# Patient Record
Sex: Female | Born: 1960 | Race: Black or African American | Hispanic: No | Marital: Single | Smoking: Never smoker
Health system: Southern US, Community
[De-identification: ages and names within clinical notes are randomized; demographics above are authoritative.]

## PROBLEM LIST (undated history)

## (undated) DIAGNOSIS — M359 Systemic involvement of connective tissue, unspecified: Secondary | ICD-10-CM

## (undated) DIAGNOSIS — M069 Rheumatoid arthritis, unspecified: Secondary | ICD-10-CM

## (undated) DIAGNOSIS — I10 Essential (primary) hypertension: Secondary | ICD-10-CM

## (undated) DIAGNOSIS — C50912 Malignant neoplasm of unspecified site of left female breast: Secondary | ICD-10-CM

## (undated) DIAGNOSIS — C50919 Malignant neoplasm of unspecified site of unspecified female breast: Secondary | ICD-10-CM

## (undated) DIAGNOSIS — C801 Malignant (primary) neoplasm, unspecified: Secondary | ICD-10-CM

## (undated) HISTORY — DX: Rheumatoid arthritis, unspecified: M06.9

## (undated) HISTORY — DX: Malignant (primary) neoplasm, unspecified: C80.1

## (undated) HISTORY — DX: Malignant neoplasm of unspecified site of left female breast: C50.912

## (undated) HISTORY — DX: Malignant neoplasm of unspecified site of unspecified female breast: C50.919

## (undated) HISTORY — PX: CARPAL TUNNEL RELEASE: SHX101

---

## 1991-02-20 HISTORY — PX: TUBAL LIGATION: SHX77

## 2007-06-20 ENCOUNTER — Emergency Department: Payer: Self-pay | Admitting: Emergency Medicine

## 2007-07-24 ENCOUNTER — Emergency Department: Payer: Self-pay | Admitting: Emergency Medicine

## 2008-02-20 HISTORY — PX: BREAST SURGERY: SHX581

## 2009-07-20 ENCOUNTER — Ambulatory Visit: Payer: Self-pay | Admitting: Oncology

## 2009-08-04 ENCOUNTER — Ambulatory Visit: Payer: Self-pay | Admitting: Oncology

## 2009-08-05 ENCOUNTER — Ambulatory Visit: Payer: Self-pay | Admitting: Oncology

## 2009-08-09 ENCOUNTER — Ambulatory Visit: Payer: Self-pay | Admitting: Oncology

## 2009-08-17 ENCOUNTER — Ambulatory Visit: Payer: Self-pay | Admitting: General Surgery

## 2009-08-19 ENCOUNTER — Ambulatory Visit: Payer: Self-pay | Admitting: Oncology

## 2009-08-28 LAB — CANCER ANTIGEN 27.29: CA 27.29: 16.1 U/mL (ref 0.0–38.6)

## 2009-09-19 ENCOUNTER — Ambulatory Visit: Payer: Self-pay | Admitting: Oncology

## 2009-10-20 ENCOUNTER — Ambulatory Visit: Payer: Self-pay | Admitting: Oncology

## 2009-11-19 ENCOUNTER — Ambulatory Visit: Payer: Self-pay | Admitting: Oncology

## 2009-12-20 ENCOUNTER — Ambulatory Visit: Payer: Self-pay | Admitting: Oncology

## 2010-01-19 ENCOUNTER — Ambulatory Visit: Payer: Self-pay | Admitting: Oncology

## 2010-02-19 ENCOUNTER — Ambulatory Visit: Payer: Self-pay | Admitting: Oncology

## 2010-02-19 DIAGNOSIS — C801 Malignant (primary) neoplasm, unspecified: Secondary | ICD-10-CM

## 2010-02-19 HISTORY — DX: Malignant (primary) neoplasm, unspecified: C80.1

## 2010-02-27 ENCOUNTER — Ambulatory Visit: Payer: Self-pay | Admitting: Anesthesiology

## 2010-03-01 ENCOUNTER — Ambulatory Visit: Payer: Self-pay | Admitting: General Surgery

## 2010-03-07 LAB — PATHOLOGY REPORT

## 2010-03-12 ENCOUNTER — Encounter: Payer: Self-pay | Admitting: Radiology

## 2010-03-22 ENCOUNTER — Ambulatory Visit: Payer: Self-pay | Admitting: Oncology

## 2010-04-20 ENCOUNTER — Ambulatory Visit: Payer: Self-pay | Admitting: Oncology

## 2010-05-21 ENCOUNTER — Ambulatory Visit: Payer: Self-pay | Admitting: Oncology

## 2010-06-20 ENCOUNTER — Ambulatory Visit: Payer: Self-pay | Admitting: Oncology

## 2010-07-21 ENCOUNTER — Ambulatory Visit: Payer: Self-pay | Admitting: Oncology

## 2010-08-20 ENCOUNTER — Ambulatory Visit: Payer: Self-pay | Admitting: Oncology

## 2010-09-15 LAB — CANCER ANTIGEN 27.29: CA 27.29: 12.4 U/mL (ref 0.0–38.6)

## 2010-09-20 ENCOUNTER — Ambulatory Visit: Payer: Self-pay | Admitting: Oncology

## 2010-10-21 ENCOUNTER — Ambulatory Visit: Payer: Self-pay | Admitting: Oncology

## 2011-01-17 ENCOUNTER — Ambulatory Visit: Payer: Self-pay | Admitting: Oncology

## 2011-01-20 ENCOUNTER — Ambulatory Visit: Payer: Self-pay | Admitting: Oncology

## 2011-01-24 ENCOUNTER — Ambulatory Visit: Payer: Self-pay | Admitting: General Surgery

## 2011-03-28 ENCOUNTER — Ambulatory Visit: Payer: Self-pay | Admitting: Radiation Oncology

## 2011-03-28 ENCOUNTER — Ambulatory Visit: Payer: Self-pay | Admitting: Oncology

## 2011-03-30 ENCOUNTER — Emergency Department: Payer: Self-pay | Admitting: Unknown Physician Specialty

## 2011-04-20 ENCOUNTER — Ambulatory Visit: Payer: Self-pay | Admitting: Oncology

## 2011-05-22 ENCOUNTER — Ambulatory Visit: Payer: Self-pay | Admitting: Oncology

## 2011-05-22 LAB — COMPREHENSIVE METABOLIC PANEL
Anion Gap: 9 (ref 7–16)
BUN: 13 mg/dL (ref 7–18)
Bilirubin,Total: 0.4 mg/dL (ref 0.2–1.0)
Calcium, Total: 9.1 mg/dL (ref 8.5–10.1)
Chloride: 102 mmol/L (ref 98–107)
Co2: 29 mmol/L (ref 21–32)
EGFR (African American): >60
EGFR (Non-African Amer.): 56 — ABNORMAL LOW
Glucose: 111 mg/dL — ABNORMAL HIGH (ref 65–99)
Osmolality: 280 (ref 275–301)
SGOT(AST): 23 U/L (ref 15–37)
SGPT (ALT): 28 U/L
Sodium: 140 mmol/L (ref 136–145)
Total Protein: 8 g/dL (ref 6.4–8.2)

## 2011-05-22 LAB — CBC CANCER CENTER
Basophil #: 0 x10 3/mm (ref 0.0–0.1)
Basophil %: 0.4 %
HCT: 31.9 % — ABNORMAL LOW (ref 35.0–47.0)
HGB: 10.8 g/dL — ABNORMAL LOW (ref 12.0–16.0)
Lymphocyte #: 2.1 x10 3/mm (ref 1.0–3.6)
MCHC: 33.9 g/dL (ref 32.0–36.0)
MCV: 90 fL (ref 80–100)
Monocyte #: 0.4 x10 3/mm (ref 0.0–0.7)
Monocyte %: 9.6 %
Neutrophil #: 1.5 x10 3/mm (ref 1.4–6.5)
Neutrophil %: 37.5 %
Platelet: 188 x10 3/mm (ref 150–440)
RBC: 3.53 10*6/uL — ABNORMAL LOW (ref 3.80–5.20)

## 2011-05-23 LAB — CANCER ANTIGEN 27.29: CA 27.29: 18.2 U/mL (ref 0.0–38.6)

## 2011-06-20 ENCOUNTER — Ambulatory Visit: Payer: Self-pay | Admitting: Oncology

## 2011-08-27 ENCOUNTER — Ambulatory Visit: Payer: Self-pay | Admitting: General Surgery

## 2011-12-13 ENCOUNTER — Ambulatory Visit: Payer: Self-pay | Admitting: Oncology

## 2011-12-13 LAB — COMPREHENSIVE METABOLIC PANEL
Albumin: 3.7 g/dL (ref 3.4–5.0)
Alkaline Phosphatase: 91 U/L (ref 50–136)
BUN: 16 mg/dL (ref 7–18)
EGFR (African American): >60
Glucose: 105 mg/dL — ABNORMAL HIGH (ref 65–99)
SGOT(AST): 22 U/L (ref 15–37)
SGPT (ALT): 24 U/L (ref 12–78)
Total Protein: 8.2 g/dL (ref 6.4–8.2)

## 2011-12-13 LAB — CBC CANCER CENTER
Basophil %: 0.4 %
Eosinophil #: 0.1 x10 3/mm (ref 0.0–0.7)
Eosinophil %: 2.3 %
HGB: 10.4 g/dL — ABNORMAL LOW (ref 12.0–16.0)
MCH: 30.2 pg (ref 26.0–34.0)
MCHC: 33.1 g/dL (ref 32.0–36.0)
MCV: 91 fL (ref 80–100)
Monocyte #: 0.4 x10 3/mm (ref 0.2–0.9)
Monocyte %: 10.6 %
Neutrophil %: 39.9 %
Platelet: 202 x10 3/mm (ref 150–440)

## 2011-12-14 LAB — CANCER ANTIGEN 27.29: CA 27.29: 13.4 U/mL (ref 0.0–38.6)

## 2011-12-21 ENCOUNTER — Ambulatory Visit: Payer: Self-pay | Admitting: Oncology

## 2012-01-20 ENCOUNTER — Ambulatory Visit: Payer: Self-pay | Admitting: Oncology

## 2012-03-22 ENCOUNTER — Ambulatory Visit: Payer: Self-pay | Admitting: Oncology

## 2012-04-19 ENCOUNTER — Ambulatory Visit: Payer: Self-pay | Admitting: Oncology

## 2012-05-20 ENCOUNTER — Ambulatory Visit: Payer: Self-pay | Admitting: Oncology

## 2012-06-05 LAB — CBC CANCER CENTER
Basophil %: 0.3 %
Eosinophil %: 1.1 %
HCT: 30.9 % — ABNORMAL LOW (ref 35.0–47.0)
HGB: 10.3 g/dL — ABNORMAL LOW (ref 12.0–16.0)
Lymphocyte #: 1.6 x10 3/mm (ref 1.0–3.6)
MCH: 30.3 pg (ref 26.0–34.0)
MCHC: 33.5 g/dL (ref 32.0–36.0)
MCV: 91 fL (ref 80–100)
Monocyte #: 0.6 x10 3/mm (ref 0.2–0.9)
Neutrophil #: 4.4 x10 3/mm (ref 1.4–6.5)
Neutrophil %: 66 %
RBC: 3.41 10*6/uL — ABNORMAL LOW (ref 3.80–5.20)
RDW: 14.1 % (ref 11.5–14.5)
WBC: 6.6 x10 3/mm (ref 3.6–11.0)

## 2012-06-05 LAB — COMPREHENSIVE METABOLIC PANEL
Alkaline Phosphatase: 87 U/L (ref 50–136)
BUN: 15 mg/dL (ref 7–18)
Chloride: 101 mmol/L (ref 98–107)
Co2: 30 mmol/L (ref 21–32)
Creatinine: 1.02 mg/dL (ref 0.60–1.30)
Glucose: 118 mg/dL — ABNORMAL HIGH (ref 65–99)
SGPT (ALT): 20 U/L (ref 12–78)
Sodium: 139 mmol/L (ref 136–145)
Total Protein: 8.5 g/dL — ABNORMAL HIGH (ref 6.4–8.2)

## 2012-06-19 ENCOUNTER — Ambulatory Visit: Payer: Self-pay | Admitting: Oncology

## 2012-08-27 ENCOUNTER — Ambulatory Visit: Payer: Self-pay | Admitting: Oncology

## 2012-12-23 ENCOUNTER — Ambulatory Visit: Payer: Self-pay | Admitting: Oncology

## 2012-12-23 LAB — CBC CANCER CENTER
Basophil %: 0.5 %
Eosinophil #: 0.1 x10 3/mm (ref 0.0–0.7)
Eosinophil %: 1.7 %
HCT: 34.4 % — ABNORMAL LOW (ref 35.0–47.0)
Lymphocyte %: 39.8 %
Monocyte %: 11.5 %
Neutrophil %: 46.5 %
Platelet: 212 x10 3/mm (ref 150–440)
RDW: 15.6 % — ABNORMAL HIGH (ref 11.5–14.5)
WBC: 4.9 x10 3/mm (ref 3.6–11.0)

## 2012-12-23 LAB — COMPREHENSIVE METABOLIC PANEL
Anion Gap: 6 — ABNORMAL LOW (ref 7–16)
BUN: 16 mg/dL (ref 7–18)
Bilirubin,Total: 0.3 mg/dL (ref 0.2–1.0)
Calcium, Total: 9.4 mg/dL (ref 8.5–10.1)
Chloride: 104 mmol/L (ref 98–107)
Co2: 29 mmol/L (ref 21–32)
Creatinine: 1.24 mg/dL (ref 0.60–1.30)
EGFR (African American): 58 — ABNORMAL LOW
Glucose: 96 mg/dL (ref 65–99)
Total Protein: 8.2 g/dL (ref 6.4–8.2)

## 2012-12-24 LAB — CANCER ANTIGEN 27.29: CA 27.29: 16.8 U/mL (ref 0.0–38.6)

## 2013-01-19 ENCOUNTER — Ambulatory Visit: Payer: Self-pay | Admitting: Oncology

## 2013-04-14 ENCOUNTER — Ambulatory Visit: Payer: Self-pay | Admitting: Radiation Oncology

## 2013-06-23 ENCOUNTER — Ambulatory Visit: Payer: Self-pay | Admitting: Oncology

## 2013-06-23 LAB — CBC CANCER CENTER
Basophil #: 0 x10 3/mm (ref 0.0–0.1)
Basophil %: 0.6 %
Eosinophil #: 0.1 x10 3/mm (ref 0.0–0.7)
Eosinophil %: 2.1 %
HCT: 32.4 % — AB (ref 35.0–47.0)
HGB: 10.6 g/dL — AB (ref 12.0–16.0)
Lymphocyte #: 1.8 x10 3/mm (ref 1.0–3.6)
Lymphocyte %: 34.4 %
MCH: 29.6 pg (ref 26.0–34.0)
MCHC: 32.6 g/dL (ref 32.0–36.0)
MCV: 91 fL (ref 80–100)
MONOS PCT: 7.5 %
Monocyte #: 0.4 x10 3/mm (ref 0.2–0.9)
Neutrophil #: 2.9 x10 3/mm (ref 1.4–6.5)
Neutrophil %: 55.4 %
Platelet: 230 x10 3/mm (ref 150–440)
RBC: 3.57 10*6/uL — ABNORMAL LOW (ref 3.80–5.20)
RDW: 15.3 % — ABNORMAL HIGH (ref 11.5–14.5)
WBC: 5.2 x10 3/mm (ref 3.6–11.0)

## 2013-06-23 LAB — COMPREHENSIVE METABOLIC PANEL
ANION GAP: 7 (ref 7–16)
AST: 23 U/L (ref 15–37)
Albumin: 3.5 g/dL (ref 3.4–5.0)
Alkaline Phosphatase: 92 U/L
BILIRUBIN TOTAL: 0.3 mg/dL (ref 0.2–1.0)
BUN: 15 mg/dL (ref 7–18)
CALCIUM: 8.8 mg/dL (ref 8.5–10.1)
CO2: 29 mmol/L (ref 21–32)
CREATININE: 1.02 mg/dL (ref 0.60–1.30)
Chloride: 103 mmol/L (ref 98–107)
EGFR (African American): >60
EGFR (Non-African Amer.): >60
Glucose: 108 mg/dL — ABNORMAL HIGH (ref 65–99)
OSMOLALITY: 279 (ref 275–301)
Potassium: 4.1 mmol/L (ref 3.5–5.1)
SGPT (ALT): 21 U/L (ref 12–78)
Sodium: 139 mmol/L (ref 136–145)
TOTAL PROTEIN: 8.4 g/dL — AB (ref 6.4–8.2)

## 2013-07-20 ENCOUNTER — Ambulatory Visit: Payer: Self-pay | Admitting: Oncology

## 2013-08-03 DIAGNOSIS — M1990 Unspecified osteoarthritis, unspecified site: Secondary | ICD-10-CM | POA: Insufficient documentation

## 2013-08-03 DIAGNOSIS — M059 Rheumatoid arthritis with rheumatoid factor, unspecified: Secondary | ICD-10-CM | POA: Insufficient documentation

## 2013-08-03 DIAGNOSIS — C50919 Malignant neoplasm of unspecified site of unspecified female breast: Secondary | ICD-10-CM | POA: Insufficient documentation

## 2013-08-03 DIAGNOSIS — D649 Anemia, unspecified: Secondary | ICD-10-CM | POA: Insufficient documentation

## 2013-08-28 ENCOUNTER — Ambulatory Visit: Payer: Self-pay | Admitting: Oncology

## 2013-09-08 ENCOUNTER — Other Ambulatory Visit: Payer: Self-pay | Admitting: Oncology

## 2013-09-08 DIAGNOSIS — Z853 Personal history of malignant neoplasm of breast: Secondary | ICD-10-CM

## 2013-09-17 ENCOUNTER — Ambulatory Visit
Admission: RE | Admit: 2013-09-17 | Discharge: 2013-09-17 | Disposition: A | Discharge status: Home / Self Care | Payer: Medicare Other | Source: Ambulatory Visit | Attending: Oncology | Admitting: Oncology

## 2013-09-17 DIAGNOSIS — Z853 Personal history of malignant neoplasm of breast: Secondary | ICD-10-CM

## 2013-09-17 MED ORDER — GADOBENATE DIMEGLUMINE 529 MG/ML IV SOLN
19.0000 mL | Freq: Once | INTRAVENOUS | Status: AC | PRN
  Administered 2013-09-17: 19 mL via INTRAVENOUS

## 2013-09-29 ENCOUNTER — Ambulatory Visit: Payer: Self-pay | Admitting: Oncology

## 2013-10-20 ENCOUNTER — Ambulatory Visit: Payer: Self-pay | Admitting: Oncology

## 2013-12-02 ENCOUNTER — Ambulatory Visit: Payer: Self-pay | Admitting: Oncology

## 2013-12-29 ENCOUNTER — Ambulatory Visit: Payer: Self-pay | Admitting: Oncology

## 2013-12-29 LAB — CBC CANCER CENTER
Basophil #: 0 x10 3/mm (ref 0.0–0.1)
Basophil %: 0.7 %
EOS ABS: 0.1 x10 3/mm (ref 0.0–0.7)
EOS PCT: 1.5 %
HCT: 29.1 % — AB (ref 35.0–47.0)
HGB: 9.4 g/dL — ABNORMAL LOW (ref 12.0–16.0)
LYMPHS ABS: 1.5 x10 3/mm (ref 1.0–3.6)
LYMPHS PCT: 35.6 %
MCH: 29.2 pg (ref 26.0–34.0)
MCHC: 32.3 g/dL (ref 32.0–36.0)
MCV: 91 fL (ref 80–100)
MONO ABS: 0.3 x10 3/mm (ref 0.2–0.9)
Monocyte %: 7.4 %
NEUTROS ABS: 2.3 x10 3/mm (ref 1.4–6.5)
Neutrophil %: 54.8 %
PLATELETS: 234 x10 3/mm (ref 150–440)
RBC: 3.21 10*6/uL — AB (ref 3.80–5.20)
RDW: 15.4 % — ABNORMAL HIGH (ref 11.5–14.5)
WBC: 4.3 x10 3/mm (ref 3.6–11.0)

## 2013-12-29 LAB — COMPREHENSIVE METABOLIC PANEL
Albumin: 3.2 g/dL — ABNORMAL LOW (ref 3.4–5.0)
Alkaline Phosphatase: 125 U/L — ABNORMAL HIGH
Anion Gap: 9 (ref 7–16)
BUN: 17 mg/dL (ref 7–18)
Bilirubin,Total: 0.3 mg/dL (ref 0.2–1.0)
Calcium, Total: 9 mg/dL (ref 8.5–10.1)
Chloride: 105 mmol/L (ref 98–107)
Co2: 27 mmol/L (ref 21–32)
Creatinine: 0.99 mg/dL (ref 0.60–1.30)
EGFR (Non-African Amer.): >60
Glucose: 90 mg/dL (ref 65–99)
OSMOLALITY: 282 (ref 275–301)
POTASSIUM: 3.7 mmol/L (ref 3.5–5.1)
SGOT(AST): 61 U/L — ABNORMAL HIGH (ref 15–37)
SGPT (ALT): 49 U/L
Sodium: 141 mmol/L (ref 136–145)
Total Protein: 8.6 g/dL — ABNORMAL HIGH (ref 6.4–8.2)

## 2013-12-30 LAB — CANCER ANTIGEN 27.29: CA 27.29: 193.1 U/mL — ABNORMAL HIGH (ref 0.0–38.6)

## 2014-01-04 ENCOUNTER — Ambulatory Visit: Payer: Self-pay | Admitting: Oncology

## 2014-01-06 ENCOUNTER — Encounter: Payer: Self-pay | Admitting: *Deleted

## 2014-01-06 LAB — COMPREHENSIVE METABOLIC PANEL
ALK PHOS: 141 U/L — AB
Albumin: 3.6 g/dL (ref 3.4–5.0)
Anion Gap: 13 (ref 7–16)
BUN: 15 mg/dL (ref 7–18)
Bilirubin,Total: 0.7 mg/dL (ref 0.2–1.0)
Calcium, Total: 9.9 mg/dL (ref 8.5–10.1)
Chloride: 99 mmol/L (ref 98–107)
Co2: 24 mmol/L (ref 21–32)
Creatinine: 1.1 mg/dL (ref 0.60–1.30)
GFR CALC NON AF AMER: 55 — AB
Glucose: 103 mg/dL — ABNORMAL HIGH (ref 65–99)
Osmolality: 273 (ref 275–301)
Potassium: 3.8 mmol/L (ref 3.5–5.1)
SGOT(AST): 112 U/L — ABNORMAL HIGH (ref 15–37)
SGPT (ALT): 95 U/L — ABNORMAL HIGH
SODIUM: 136 mmol/L (ref 136–145)
Total Protein: 9.1 g/dL — ABNORMAL HIGH (ref 6.4–8.2)

## 2014-01-06 LAB — CBC CANCER CENTER
Basophil #: 0 x10 3/mm (ref 0.0–0.1)
Basophil %: 0.7 %
Eosinophil #: 0 x10 3/mm (ref 0.0–0.7)
Eosinophil %: 0.4 %
HCT: 32.3 % — ABNORMAL LOW (ref 35.0–47.0)
HGB: 10.4 g/dL — ABNORMAL LOW (ref 12.0–16.0)
LYMPHS PCT: 28.1 %
Lymphocyte #: 1.7 x10 3/mm (ref 1.0–3.6)
MCH: 28.8 pg (ref 26.0–34.0)
MCHC: 32.1 g/dL (ref 32.0–36.0)
MCV: 90 fL (ref 80–100)
MONO ABS: 0.5 x10 3/mm (ref 0.2–0.9)
Monocyte %: 8.9 %
NEUTROS ABS: 3.7 x10 3/mm (ref 1.4–6.5)
Neutrophil %: 61.9 %
Platelet: 252 x10 3/mm (ref 150–440)
RBC: 3.6 10*6/uL — AB (ref 3.80–5.20)
RDW: 15.9 % — ABNORMAL HIGH (ref 11.5–14.5)
WBC: 5.9 x10 3/mm (ref 3.6–11.0)

## 2014-01-06 LAB — PROTIME-INR
INR: 1
Prothrombin Time: 12.9 secs (ref 11.5–14.7)

## 2014-01-06 LAB — APTT: Activated PTT: 26.4 secs (ref 23.6–35.9)

## 2014-01-07 LAB — CANCER ANTIGEN 27.29: CA 27.29: 285.6 U/mL — ABNORMAL HIGH (ref 0.0–38.6)

## 2014-01-08 ENCOUNTER — Encounter: Payer: Self-pay | Admitting: General Surgery

## 2014-01-08 ENCOUNTER — Ambulatory Visit (INDEPENDENT_AMBULATORY_CARE_PROVIDER_SITE_OTHER): Payer: Medicare Other | Admitting: General Surgery

## 2014-01-08 ENCOUNTER — Other Ambulatory Visit: Payer: Medicare Other

## 2014-01-08 VITALS — BP 140/82 | HR 80 | Resp 18 | Ht 60.0 in | Wt 190.0 lb

## 2014-01-08 DIAGNOSIS — C50912 Malignant neoplasm of unspecified site of left female breast: Secondary | ICD-10-CM

## 2014-01-08 DIAGNOSIS — C50911 Malignant neoplasm of unspecified site of right female breast: Secondary | ICD-10-CM

## 2014-01-08 NOTE — Progress Notes (Signed)
Patient ID: Beth Flores, female   DOB: July 26, 1960, 53 y.o.   MRN: [id number]  Chief Complaint  Patient presents with  . Other    port placement    HPI Beth Flores is a 53 y.o. female who presents for an evaluation of a port placement. 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 Had Lumpectomy, AD and radiation and chemo. Recently c/o chest pain and sob. CT and subsequent PET showing mets in lung and liver, possible right supraclavicular node. She also needs a biopsy to confirm metastatic breast CA.  HPI  Past Medical History  Diagnosis Date  . Rheumatoid arthritis   . Cancer     breast     Past Surgical History  Procedure Laterality Date  . Breast surgery  2010    lumpectomy  . Tubal ligation  1993  . Carpal tunnel release Bilateral     Family History  Problem Relation Age of Onset  . Cancer Father     colon    Social History History  Substance Use Topics  . Smoking status: Never Smoker   . Smokeless tobacco: Never Used  . Alcohol Use: No    No Known Allergies  Current Outpatient Prescriptions  Medication Sig Dispense Refill  . Ferrous Sulfate (SLOW FE) 142 (45 FE) MG TBCR Take 1 tablet by mouth daily.    . folic acid (FOLVITE) 1 MG tablet Take 1 mg by mouth daily.    . hydrochlorothiazide (MICROZIDE) 12.5 MG capsule Take 12.5 mg by mouth daily.    . hydroxychloroquine (PLAQUENIL) 200 MG tablet Take 200 mg by mouth daily.    Marland Kitchen ibuprofen (ADVIL,MOTRIN) 800 MG tablet Take 800 mg by mouth every 8 (eight) hours as needed.    . loratadine (CLARITIN) 10 MG tablet Take 10 mg by mouth daily.    . methotrexate (RHEUMATREX) 2.5 MG tablet Caution:Chemotherapy. Protect from light.  6 tablets by mouth weekly    . pantoprazole (PROTONIX) 40 MG tablet Take 40 mg by mouth daily.     No current facility-administered medications for this visit.    Review of Systems Review of Systems  Constitutional: Negative.   Respiratory: Negative.    Cardiovascular: Negative.     Blood pressure 140/82, pulse 80, resp. rate 18, height 5' (1.524 m), weight 190 lb (86.183 kg).  Physical Exam Physical Exam  Constitutional: She is oriented to person, place, and time. She appears well-developed and well-nourished.  Cardiovascular: Normal rate, regular rhythm and normal heart sounds.   No murmur heard. Pulmonary/Chest: Effort normal and breath sounds normal.  Neurological: She is alert and oriented to person, place, and time.  Skin: Skin is warm and dry.    Data Reviewed PET scan Ultrasound right supraclavicular area performed showing an abnormal 2cm lymph node. This correlates with finding on PET scan.   Assessment    Suspected metastases from left breast cancer. Agree with port placement and biopsy. Neck node is not suitable core biopsy. Open biopsy can be done at same time as port. Discussed fully with pt and she is agreeable.    Plan    Port placement and biopsy of supraclavicular node at same setting.        Zeyna Mkrtchyan G 01/08/2014, 12:46 PM

## 2014-01-08 NOTE — Patient Instructions (Signed)
Patient to be scheduled for port placement. The patient is aware to call back for any questions or concerns.

## 2014-01-08 NOTE — Addendum Note (Signed)
Addended by: Christene Lye on: 01/08/2014 12:53 PM   Modules accepted: Orders

## 2014-01-08 NOTE — Progress Notes (Signed)
Patient ID: Beth Flores, female   DOB: September 22, 1960, 53 y.o.   MRN: [id number]   Patient is scheduled for surgery at [facility] on 01/19/14. She will pre admit by phone. Patient is aware of date and instructions.

## 2014-01-11 ENCOUNTER — Other Ambulatory Visit: Payer: Self-pay | Admitting: General Surgery

## 2014-01-11 DIAGNOSIS — C50912 Malignant neoplasm of unspecified site of left female breast: Secondary | ICD-10-CM

## 2014-01-12 ENCOUNTER — Ambulatory Visit: Payer: Self-pay | Admitting: Anesthesiology

## 2014-01-18 ENCOUNTER — Ambulatory Visit: Payer: Self-pay | Admitting: General Surgery

## 2014-01-19 ENCOUNTER — Ambulatory Visit: Payer: Self-pay | Admitting: General Surgery

## 2014-01-19 ENCOUNTER — Encounter: Payer: Self-pay | Admitting: General Surgery

## 2014-01-19 ENCOUNTER — Ambulatory Visit: Payer: Self-pay | Admitting: Oncology

## 2014-01-19 DIAGNOSIS — C50912 Malignant neoplasm of unspecified site of left female breast: Secondary | ICD-10-CM | POA: Diagnosis not present

## 2014-01-19 DIAGNOSIS — C50911 Malignant neoplasm of unspecified site of right female breast: Secondary | ICD-10-CM | POA: Diagnosis not present

## 2014-01-19 HISTORY — PX: PORTACATH PLACEMENT: SHX2246

## 2014-01-20 ENCOUNTER — Encounter: Payer: Self-pay | Admitting: General Surgery

## 2014-01-20 ENCOUNTER — Ambulatory Visit: Payer: Self-pay | Admitting: Oncology

## 2014-01-21 ENCOUNTER — Encounter: Payer: Self-pay | Admitting: General Surgery

## 2014-01-21 LAB — COMPREHENSIVE METABOLIC PANEL
Albumin: 3 g/dL — ABNORMAL LOW (ref 3.4–5.0)
Alkaline Phosphatase: 149 U/L — ABNORMAL HIGH
Anion Gap: 11 (ref 7–16)
BUN: 15 mg/dL (ref 7–18)
Bilirubin,Total: 0.3 mg/dL (ref 0.2–1.0)
CO2: 27 mmol/L (ref 21–32)
Calcium, Total: 9.2 mg/dL (ref 8.5–10.1)
Chloride: 100 mmol/L (ref 98–107)
Creatinine: 1.09 mg/dL (ref 0.60–1.30)
EGFR (African American): >60
EGFR (Non-African Amer.): 56 — ABNORMAL LOW
Glucose: 124 mg/dL — ABNORMAL HIGH (ref 65–99)
Osmolality: 278 (ref 275–301)
POTASSIUM: 3.4 mmol/L — AB (ref 3.5–5.1)
SGOT(AST): 132 U/L — ABNORMAL HIGH (ref 15–37)
SGPT (ALT): 82 U/L — ABNORMAL HIGH
Sodium: 138 mmol/L (ref 136–145)
TOTAL PROTEIN: 8.1 g/dL (ref 6.4–8.2)

## 2014-01-21 LAB — CBC CANCER CENTER
BASOS ABS: 0 x10 3/mm (ref 0.0–0.1)
BASOS PCT: 0.3 %
EOS ABS: 0.1 x10 3/mm (ref 0.0–0.7)
EOS PCT: 0.7 %
HCT: 28.5 % — ABNORMAL LOW (ref 35.0–47.0)
HGB: 9.2 g/dL — AB (ref 12.0–16.0)
LYMPHS ABS: 2.3 x10 3/mm (ref 1.0–3.6)
LYMPHS PCT: 30.4 %
MCH: 28.5 pg (ref 26.0–34.0)
MCHC: 32.3 g/dL (ref 32.0–36.0)
MCV: 88 fL (ref 80–100)
MONOS PCT: 6.9 %
Monocyte #: 0.5 x10 3/mm (ref 0.2–0.9)
NEUTROS PCT: 61.7 %
Neutrophil #: 4.7 x10 3/mm (ref 1.4–6.5)
Platelet: 249 x10 3/mm (ref 150–440)
RBC: 3.24 10*6/uL — AB (ref 3.80–5.20)
RDW: 15.7 % — AB (ref 11.5–14.5)
WBC: 7.6 x10 3/mm (ref 3.6–11.0)

## 2014-01-28 LAB — CBC CANCER CENTER
BASOS ABS: 0 x10 3/mm (ref 0.0–0.1)
BASOS PCT: 0.5 %
EOS PCT: 2 %
Eosinophil #: 0.1 x10 3/mm (ref 0.0–0.7)
HCT: 28.6 % — ABNORMAL LOW (ref 35.0–47.0)
HGB: 9.1 g/dL — AB (ref 12.0–16.0)
LYMPHS PCT: 47.8 %
Lymphocyte #: 2.1 x10 3/mm (ref 1.0–3.6)
MCH: 28.1 pg (ref 26.0–34.0)
MCHC: 31.8 g/dL — ABNORMAL LOW (ref 32.0–36.0)
MCV: 88 fL (ref 80–100)
MONO ABS: 0.4 x10 3/mm (ref 0.2–0.9)
Monocyte %: 8.1 %
NEUTROS PCT: 41.6 %
Neutrophil #: 1.8 x10 3/mm (ref 1.4–6.5)
PLATELETS: 203 x10 3/mm (ref 150–440)
RBC: 3.24 10*6/uL — ABNORMAL LOW (ref 3.80–5.20)
RDW: 15.9 % — AB (ref 11.5–14.5)
WBC: 4.4 x10 3/mm (ref 3.6–11.0)

## 2014-02-01 ENCOUNTER — Ambulatory Visit (INDEPENDENT_AMBULATORY_CARE_PROVIDER_SITE_OTHER): Payer: Self-pay | Admitting: General Surgery

## 2014-02-01 ENCOUNTER — Encounter: Payer: Self-pay | Admitting: General Surgery

## 2014-02-01 VITALS — BP 118/78 | HR 76 | Resp 14 | Ht 61.0 in | Wt 181.0 lb

## 2014-02-01 DIAGNOSIS — C50912 Malignant neoplasm of unspecified site of left female breast: Secondary | ICD-10-CM

## 2014-02-01 NOTE — Patient Instructions (Addendum)
Patient to return as needed. Call for any problems with port

## 2014-02-01 NOTE — Progress Notes (Signed)
This is a 53 year old female following up from a port placement done on 01/19/14. Patient states she is doing okay.   Port site is intact and healing well. Lungs are clear. She also had biopsy of supraclavicular node- positive for metastatic breast CA  Patient to return as needed.

## 2014-02-04 LAB — CBC CANCER CENTER
BASOS ABS: 0 x10 3/mm (ref 0.0–0.1)
Basophil %: 1.2 %
EOS PCT: 4.5 %
Eosinophil #: 0.1 x10 3/mm (ref 0.0–0.7)
HCT: 30.7 % — AB (ref 35.0–47.0)
HGB: 9.8 g/dL — AB (ref 12.0–16.0)
LYMPHS PCT: 67.2 %
Lymphocyte #: 2 x10 3/mm (ref 1.0–3.6)
MCH: 28.5 pg (ref 26.0–34.0)
MCHC: 32 g/dL (ref 32.0–36.0)
MCV: 89 fL (ref 80–100)
MONOS PCT: 10.2 %
Monocyte #: 0.3 x10 3/mm (ref 0.2–0.9)
NEUTROS ABS: 0.5 x10 3/mm — AB (ref 1.4–6.5)
NEUTROS PCT: 16.9 %
PLATELETS: 227 x10 3/mm (ref 150–440)
RBC: 3.45 10*6/uL — ABNORMAL LOW (ref 3.80–5.20)
RDW: 16.4 % — AB (ref 11.5–14.5)
WBC: 3 x10 3/mm — AB (ref 3.6–11.0)

## 2014-02-11 LAB — COMPREHENSIVE METABOLIC PANEL
Albumin: 3.3 g/dL — ABNORMAL LOW (ref 3.4–5.0)
Alkaline Phosphatase: 157 U/L — ABNORMAL HIGH
Anion Gap: 11 (ref 7–16)
BUN: 9 mg/dL (ref 7–18)
Bilirubin,Total: 0.3 mg/dL (ref 0.2–1.0)
Calcium, Total: 8.9 mg/dL (ref 8.5–10.1)
Chloride: 104 mmol/L (ref 98–107)
Co2: 25 mmol/L (ref 21–32)
Creatinine: 0.91 mg/dL (ref 0.60–1.30)
EGFR (African American): >60
EGFR (Non-African Amer.): >60
Glucose: 101 mg/dL — ABNORMAL HIGH (ref 65–99)
Osmolality: 278 (ref 275–301)
Potassium: 3.8 mmol/L (ref 3.5–5.1)
SGOT(AST): 43 U/L — ABNORMAL HIGH (ref 15–37)
SGPT (ALT): 34 U/L
Sodium: 140 mmol/L (ref 136–145)
Total Protein: 7.7 g/dL (ref 6.4–8.2)

## 2014-02-11 LAB — CBC CANCER CENTER
Basophil #: 0 x10 3/mm (ref 0.0–0.1)
Basophil %: 0.3 %
Eosinophil #: 0.1 x10 3/mm (ref 0.0–0.7)
Eosinophil %: 2.3 %
HCT: 29.1 % — ABNORMAL LOW (ref 35.0–47.0)
HGB: 9.3 g/dL — ABNORMAL LOW (ref 12.0–16.0)
Lymphocyte #: 1.5 x10 3/mm (ref 1.0–3.6)
Lymphocyte %: 49.2 %
MCH: 28.7 pg (ref 26.0–34.0)
MCHC: 32 g/dL (ref 32.0–36.0)
MCV: 90 fL (ref 80–100)
Monocyte #: 0.4 x10 3/mm (ref 0.2–0.9)
Monocyte %: 14.2 %
Neutrophil #: 1 x10 3/mm — ABNORMAL LOW (ref 1.4–6.5)
Neutrophil %: 34 %
Platelet: 136 x10 3/mm — ABNORMAL LOW (ref 150–440)
RBC: 3.25 10*6/uL — ABNORMAL LOW (ref 3.80–5.20)
RDW: 18.4 % — ABNORMAL HIGH (ref 11.5–14.5)
WBC: 3.1 x10 3/mm — ABNORMAL LOW (ref 3.6–11.0)

## 2014-02-18 LAB — CBC CANCER CENTER
Basophil #: 0 x10 3/mm (ref 0.0–0.1)
Basophil %: 0.6 %
EOS PCT: 1.2 %
Eosinophil #: 0 x10 3/mm (ref 0.0–0.7)
HCT: 30.8 % — AB (ref 35.0–47.0)
HGB: 10 g/dL — ABNORMAL LOW (ref 12.0–16.0)
LYMPHS PCT: 41.7 %
Lymphocyte #: 1.7 x10 3/mm (ref 1.0–3.6)
MCH: 29.2 pg (ref 26.0–34.0)
MCHC: 32.5 g/dL (ref 32.0–36.0)
MCV: 90 fL (ref 80–100)
Monocyte #: 0.3 x10 3/mm (ref 0.2–0.9)
Monocyte %: 7.3 %
NEUTROS PCT: 49.2 %
Neutrophil #: 2 x10 3/mm (ref 1.4–6.5)
PLATELETS: 181 x10 3/mm (ref 150–440)
RBC: 3.43 10*6/uL — ABNORMAL LOW (ref 3.80–5.20)
RDW: 19 % — AB (ref 11.5–14.5)
WBC: 4.1 x10 3/mm (ref 3.6–11.0)

## 2014-02-19 ENCOUNTER — Ambulatory Visit: Payer: Self-pay | Admitting: Oncology

## 2014-02-25 LAB — BASIC METABOLIC PANEL
Anion Gap: 9 (ref 7–16)
BUN: 10 mg/dL (ref 7–18)
Calcium, Total: 8.3 mg/dL — ABNORMAL LOW (ref 8.5–10.1)
Chloride: 108 mmol/L — ABNORMAL HIGH (ref 98–107)
Co2: 25 mmol/L (ref 21–32)
Creatinine: 0.8 mg/dL (ref 0.60–1.30)
GLUCOSE: 102 mg/dL — AB (ref 65–99)
OSMOLALITY: 282 (ref 275–301)
POTASSIUM: 3.9 mmol/L (ref 3.5–5.1)
SODIUM: 142 mmol/L (ref 136–145)

## 2014-02-25 LAB — CBC CANCER CENTER
Basophil #: 0 x10 3/mm (ref 0.0–0.1)
Basophil %: 1.7 %
EOS ABS: 0 x10 3/mm (ref 0.0–0.7)
EOS PCT: 1.2 %
HCT: 29.5 % — ABNORMAL LOW (ref 35.0–47.0)
HGB: 9.5 g/dL — AB (ref 12.0–16.0)
Lymphocyte #: 1.6 x10 3/mm (ref 1.0–3.6)
Lymphocyte %: 65 %
MCH: 28.9 pg (ref 26.0–34.0)
MCHC: 32.3 g/dL (ref 32.0–36.0)
MCV: 90 fL (ref 80–100)
MONO ABS: 0.2 x10 3/mm (ref 0.2–0.9)
Monocyte %: 6.6 %
Neutrophil #: 0.6 x10 3/mm — ABNORMAL LOW (ref 1.4–6.5)
Neutrophil %: 25.5 %
Platelet: 211 x10 3/mm (ref 150–440)
RBC: 3.29 10*6/uL — AB (ref 3.80–5.20)
RDW: 18 % — ABNORMAL HIGH (ref 11.5–14.5)
WBC: 2.4 x10 3/mm — ABNORMAL LOW (ref 3.6–11.0)

## 2014-03-04 LAB — CBC CANCER CENTER
Basophil #: 0 x10 3/mm (ref 0.0–0.1)
Basophil %: 0.2 %
EOS ABS: 0 x10 3/mm (ref 0.0–0.7)
Eosinophil %: 0.5 %
HCT: 28.4 % — AB (ref 35.0–47.0)
HGB: 9.2 g/dL — ABNORMAL LOW (ref 12.0–16.0)
LYMPHS PCT: 53 %
Lymphocyte #: 2 x10 3/mm (ref 1.0–3.6)
MCH: 29.8 pg (ref 26.0–34.0)
MCHC: 32.5 g/dL (ref 32.0–36.0)
MCV: 92 fL (ref 80–100)
MONOS PCT: 16.9 %
Monocyte #: 0.7 x10 3/mm (ref 0.2–0.9)
Neutrophil #: 1.1 x10 3/mm — ABNORMAL LOW (ref 1.4–6.5)
Neutrophil %: 29.4 %
PLATELETS: 173 x10 3/mm (ref 150–440)
RBC: 3.09 10*6/uL — ABNORMAL LOW (ref 3.80–5.20)
RDW: 18.8 % — AB (ref 11.5–14.5)
WBC: 3.9 x10 3/mm (ref 3.6–11.0)

## 2014-03-11 LAB — COMPREHENSIVE METABOLIC PANEL
ALBUMIN: 3.1 g/dL — AB (ref 3.4–5.0)
AST: 39 U/L — AB (ref 15–37)
Alkaline Phosphatase: 103 U/L
Anion Gap: 11 (ref 7–16)
BUN: 15 mg/dL (ref 7–18)
Bilirubin,Total: 0.5 mg/dL (ref 0.2–1.0)
CREATININE: 1.01 mg/dL (ref 0.60–1.30)
Calcium, Total: 8.8 mg/dL (ref 8.5–10.1)
Chloride: 102 mmol/L (ref 98–107)
Co2: 22 mmol/L (ref 21–32)
EGFR (Non-African Amer.): >60
Glucose: 143 mg/dL — ABNORMAL HIGH (ref 65–99)
OSMOLALITY: 273 (ref 275–301)
Potassium: 3.4 mmol/L — ABNORMAL LOW (ref 3.5–5.1)
SGPT (ALT): 29 U/L
Sodium: 135 mmol/L — ABNORMAL LOW (ref 136–145)
TOTAL PROTEIN: 8.1 g/dL (ref 6.4–8.2)

## 2014-03-11 LAB — CBC CANCER CENTER
BASOS ABS: 0 x10 3/mm (ref 0.0–0.1)
Basophil %: 0.5 %
Eosinophil #: 0 x10 3/mm (ref 0.0–0.7)
Eosinophil %: 0.2 %
HCT: 29.1 % — ABNORMAL LOW (ref 35.0–47.0)
HGB: 9.6 g/dL — ABNORMAL LOW (ref 12.0–16.0)
LYMPHS PCT: 21.8 %
Lymphocyte #: 1.4 x10 3/mm (ref 1.0–3.6)
MCH: 29.9 pg (ref 26.0–34.0)
MCHC: 33 g/dL (ref 32.0–36.0)
MCV: 91 fL (ref 80–100)
MONO ABS: 0.5 x10 3/mm (ref 0.2–0.9)
Monocyte %: 7.7 %
NEUTROS PCT: 69.8 %
Neutrophil #: 4.5 x10 3/mm (ref 1.4–6.5)
Platelet: 85 x10 3/mm — ABNORMAL LOW (ref 150–440)
RBC: 3.21 10*6/uL — ABNORMAL LOW (ref 3.80–5.20)
RDW: 18.9 % — ABNORMAL HIGH (ref 11.5–14.5)
WBC: 6.5 x10 3/mm (ref 3.6–11.0)

## 2014-03-18 LAB — CBC CANCER CENTER
Basophil #: 0 x10 3/mm (ref 0.0–0.1)
Basophil %: 0.6 %
Eosinophil #: 0 x10 3/mm (ref 0.0–0.7)
Eosinophil %: 0.6 %
HCT: 28.1 % — ABNORMAL LOW (ref 35.0–47.0)
HGB: 9.3 g/dL — ABNORMAL LOW (ref 12.0–16.0)
LYMPHS PCT: 35.4 %
Lymphocyte #: 2.1 x10 3/mm (ref 1.0–3.6)
MCH: 30.3 pg (ref 26.0–34.0)
MCHC: 33.2 g/dL (ref 32.0–36.0)
MCV: 91 fL (ref 80–100)
Monocyte #: 0.3 x10 3/mm (ref 0.2–0.9)
Monocyte %: 5.6 %
Neutrophil #: 3.4 x10 3/mm (ref 1.4–6.5)
Neutrophil %: 57.8 %
PLATELETS: 39 x10 3/mm — AB (ref 150–440)
RBC: 3.08 10*6/uL — ABNORMAL LOW (ref 3.80–5.20)
RDW: 17.9 % — AB (ref 11.5–14.5)
WBC: 5.8 x10 3/mm (ref 3.6–11.0)

## 2014-03-22 ENCOUNTER — Ambulatory Visit: Payer: Self-pay | Admitting: Oncology

## 2014-03-25 LAB — CBC CANCER CENTER
Basophil #: 0 x10 3/mm (ref 0.0–0.1)
Basophil %: 0.5 %
EOS PCT: 0 %
Eosinophil #: 0 x10 3/mm (ref 0.0–0.7)
HCT: 28.9 % — ABNORMAL LOW (ref 35.0–47.0)
HGB: 9.5 g/dL — AB (ref 12.0–16.0)
LYMPHS ABS: 3.1 x10 3/mm (ref 1.0–3.6)
LYMPHS PCT: 34.6 %
MCH: 30.5 pg (ref 26.0–34.0)
MCHC: 32.7 g/dL (ref 32.0–36.0)
MCV: 93 fL (ref 80–100)
Monocyte #: 0.9 x10 3/mm (ref 0.2–0.9)
Monocyte %: 9.9 %
Neutrophil #: 4.9 x10 3/mm (ref 1.4–6.5)
Neutrophil %: 55 %
Platelet: 112 x10 3/mm — ABNORMAL LOW (ref 150–440)
RBC: 3.1 10*6/uL — AB (ref 3.80–5.20)
RDW: 18.8 % — ABNORMAL HIGH (ref 11.5–14.5)
WBC: 8.9 x10 3/mm (ref 3.6–11.0)

## 2014-04-20 ENCOUNTER — Ambulatory Visit
Admit: 2014-04-20 | Disposition: A | Discharge status: Home / Self Care | Payer: Self-pay | Attending: Oncology | Admitting: Oncology

## 2014-05-13 LAB — CBC CANCER CENTER
Basophil #: 0.1 x10 3/mm (ref 0.0–0.1)
Basophil %: 0.9 %
EOS ABS: 0.1 x10 3/mm (ref 0.0–0.7)
Eosinophil %: 1.4 %
HCT: 26.4 % — ABNORMAL LOW (ref 35.0–47.0)
HGB: 8.8 g/dL — ABNORMAL LOW (ref 12.0–16.0)
Lymphocyte #: 2.8 x10 3/mm (ref 1.0–3.6)
Lymphocyte %: 36.3 %
MCH: 33.6 pg (ref 26.0–34.0)
MCHC: 33.4 g/dL (ref 32.0–36.0)
MCV: 101 fL — ABNORMAL HIGH (ref 80–100)
Monocyte #: 0.9 x10 3/mm (ref 0.2–0.9)
Monocyte %: 12.3 %
NEUTROS ABS: 3.8 x10 3/mm (ref 1.4–6.5)
NEUTROS PCT: 49.1 %
PLATELETS: 231 x10 3/mm (ref 150–440)
RBC: 2.63 10*6/uL — ABNORMAL LOW (ref 3.80–5.20)
RDW: 20.3 % — ABNORMAL HIGH (ref 11.5–14.5)
WBC: 7.6 x10 3/mm (ref 3.6–11.0)

## 2014-05-13 LAB — COMPREHENSIVE METABOLIC PANEL
ALBUMIN: 3.2 g/dL — AB
ALK PHOS: 110 U/L
ANION GAP: 5 — AB (ref 7–16)
AST: 80 U/L — AB
BILIRUBIN TOTAL: 0.4 mg/dL
BUN: 11 mg/dL
CALCIUM: 8.4 mg/dL — AB
CHLORIDE: 103 mmol/L
CO2: 23 mmol/L
Creatinine: 0.73 mg/dL
EGFR (African American): >60
EGFR (Non-African Amer.): >60
GLUCOSE: 124 mg/dL — AB
POTASSIUM: 3.7 mmol/L
SGPT (ALT): 46 U/L
SODIUM: 131 mmol/L — AB
TOTAL PROTEIN: 7.3 g/dL

## 2014-05-13 LAB — MAGNESIUM: Magnesium: 1.6 mg/dL — ABNORMAL LOW

## 2014-05-14 LAB — CANCER ANTIGEN 27.29: CA 27.29: 152.5 U/mL — AB (ref 0.0–38.6)

## 2014-05-20 LAB — CBC CANCER CENTER
Bands: 6 %
EOS PCT: 1 %
HCT: 28.2 % — ABNORMAL LOW (ref 35.0–47.0)
HGB: 9.3 g/dL — ABNORMAL LOW (ref 12.0–16.0)
Lymphocytes: 27 %
MCH: 33.1 pg (ref 26.0–34.0)
MCHC: 33 g/dL (ref 32.0–36.0)
MCV: 100 fL (ref 80–100)
MONOS PCT: 18 %
Myelocyte: 1 %
NRBC/100 WBC: 1 /100
PLATELETS: 181 x10 3/mm (ref 150–440)
Promyelocyte: 1 %
RBC: 2.81 10*6/uL — AB (ref 3.80–5.20)
RDW: 18.6 % — ABNORMAL HIGH (ref 11.5–14.5)
Segmented Neutrophils: 45 %
Variant Lymphocyte: 1 %
WBC: 9.1 x10 3/mm (ref 3.6–11.0)

## 2014-05-21 ENCOUNTER — Ambulatory Visit
Admit: 2014-05-21 | Disposition: A | Discharge status: Home / Self Care | Payer: Self-pay | Attending: Oncology | Admitting: Oncology

## 2014-05-27 LAB — CBC CANCER CENTER
Basophil #: 0 x10 3/mm (ref 0.0–0.1)
Basophil %: 0.4 %
EOS PCT: 0.4 %
Eosinophil #: 0 x10 3/mm (ref 0.0–0.7)
HCT: 27.2 % — ABNORMAL LOW (ref 35.0–47.0)
HGB: 9 g/dL — AB (ref 12.0–16.0)
Lymphocyte #: 2.9 x10 3/mm (ref 1.0–3.6)
Lymphocyte %: 24.7 %
MCH: 33.8 pg (ref 26.0–34.0)
MCHC: 33.2 g/dL (ref 32.0–36.0)
MCV: 102 fL — ABNORMAL HIGH (ref 80–100)
Monocyte #: 1 x10 3/mm — ABNORMAL HIGH (ref 0.2–0.9)
Monocyte %: 8.9 %
NEUTROS PCT: 65.6 %
Neutrophil #: 7.7 x10 3/mm — ABNORMAL HIGH (ref 1.4–6.5)
PLATELETS: 152 x10 3/mm (ref 150–440)
RBC: 2.67 10*6/uL — AB (ref 3.80–5.20)
RDW: 18.4 % — ABNORMAL HIGH (ref 11.5–14.5)
WBC: 11.7 x10 3/mm — ABNORMAL HIGH (ref 3.6–11.0)

## 2014-06-03 LAB — COMPREHENSIVE METABOLIC PANEL
ALBUMIN: 3.3 g/dL — AB
ALT: 47 U/L
Alkaline Phosphatase: 146 U/L — ABNORMAL HIGH
Anion Gap: 6 — ABNORMAL LOW (ref 7–16)
BILIRUBIN TOTAL: 0.8 mg/dL
BUN: 12 mg/dL
Calcium, Total: 8.8 mg/dL — ABNORMAL LOW
Chloride: 102 mmol/L
Co2: 23 mmol/L
Creatinine: 0.88 mg/dL
EGFR (African American): >60
EGFR (Non-African Amer.): >60
Glucose: 131 mg/dL — ABNORMAL HIGH
POTASSIUM: 3.5 mmol/L
SGOT(AST): 74 U/L — ABNORMAL HIGH
SODIUM: 131 mmol/L — AB
TOTAL PROTEIN: 7.8 g/dL

## 2014-06-03 LAB — CBC CANCER CENTER
Basophil #: 0 x10 3/mm (ref 0.0–0.1)
Basophil %: 0.6 %
Eosinophil #: 0 x10 3/mm (ref 0.0–0.7)
Eosinophil %: 0.5 %
HCT: 26.6 % — ABNORMAL LOW (ref 35.0–47.0)
HGB: 8.8 g/dL — ABNORMAL LOW (ref 12.0–16.0)
Lymphocyte #: 1.8 x10 3/mm (ref 1.0–3.6)
Lymphocyte %: 31.5 %
MCH: 33.7 pg (ref 26.0–34.0)
MCHC: 33.1 g/dL (ref 32.0–36.0)
MCV: 102 fL — ABNORMAL HIGH (ref 80–100)
Monocyte #: 0.7 x10 3/mm (ref 0.2–0.9)
Monocyte %: 13 %
Neutrophil #: 3.1 x10 3/mm (ref 1.4–6.5)
Neutrophil %: 54.4 %
Platelet: 129 x10 3/mm — ABNORMAL LOW (ref 150–440)
RBC: 2.62 10*6/uL — ABNORMAL LOW (ref 3.80–5.20)
RDW: 17.7 % — ABNORMAL HIGH (ref 11.5–14.5)
WBC: 5.6 x10 3/mm (ref 3.6–11.0)

## 2014-06-03 LAB — CREATININE, SERUM: Creatine, Serum: 0.88

## 2014-06-03 LAB — MAGNESIUM: Magnesium: 1.6 mg/dL — ABNORMAL LOW

## 2014-06-04 LAB — CANCER ANTIGEN 27.29: CA 27.29: 135.1 U/mL — ABNORMAL HIGH (ref 0.0–38.6)

## 2014-06-12 NOTE — Op Note (Signed)
PATIENT NAME:  Beth Flores, Beth Flores MR#:  [id number] DATE OF BIRTH:  1960-07-12  DATE OF PROCEDURE:  01/19/2014  PREOPERATIVE DIAGNOSIS: Metastatic carcinoma from the breast.   POSTOPERATIVE DIAGNOSIS: Metastatic carcinoma from the breast.   OPERATION:  1.  Supraclavicular node biopsy on the right.  2.  Insertion of venous access port.   SURGEON: S.G. Jamal Collin, MD   ANESTHESIA: General.   COMPLICATIONS: None.   BLOOD LOSS: Minimal.   DRAINS: None.   DESCRIPTION OF PROCEDURE: The patient was put to sleep with an LMA. The right neck and chest area were prepped and draped out as a sterile field. Ultrasound probe was brought up to the field and the lymph node in the supraclavicular space behind the 2 heads of the sternocleidomastoid was identified adjacent to the internal jugular vein and the artery. It was lying just lateral to the internal jugular vein. Accordingly, a transverse skin incision along the skin crease was made overlying this spot, dissected down to expose the sternomastoid muscle and the 2 heads were separated gently with a clamp and the space entered into containing the lymph node and blood vessels. Internal jugular vein was identified and carefully preserved. The node was identified and noted to be somewhat matted down and not easily freed from the surrounding tissues. Therefore, an attempt to excise this in full was felt to be treacherous. With adequate exposure on the surface, multiple punch biopsies of the lymph node were obtained and sent for frozen section which subsequently confirmed metastatic carcinoma. Tissue sampling was adequate for processing of tissue markers. There was no significant bleeding from the lymph node area. With the exposed internal jugular vein, Seldinger technique was used to place a catheter for the venous port and positioned at approximately 15 cm mark with fluoroscopy showing it in good position in the distal SVC. The previous port site in the right upper chest  area was used. The skin scar was excised out and a subcutaneous pocket created with cautery. The catheter was tunneled through to this site, cut to approximate length, and fixed to a prefilled port. The port was placed in the pocket and anchored to the underlying tissue with 3 stitches of 2-0 Prolene and flushed through with heparinized saline. Fluoroscopy was repeated to ensure that the catheter was still in good position. Some minimal oozing surrounding the catheter was noted around the entrance into the internal jugular vein. This was controlled with a small piece of Surgicel and pressure and after hemostasis was obtained, all the wounds were closed. The deeper tissue was closed with 3-0 Vicryl and the skin closed with subcuticular 4-0 Vicryl, covered with Dermabond. The patient subsequently was extubated and returned to the recovery room in stable condition.    ____________________________ S.Robinette Haines, MD sgs:LT D: 01/19/2014 17:52:09 ET T: 01/19/2014 20:11:41 ET JOB#: [id number]  cc: S.G. Jamal Collin, MD, <Dictator> [facility] Robinette Haines MD ELECTRONICALLY SIGNED 01/20/2014 10:12

## 2014-06-14 LAB — SURGICAL PATHOLOGY

## 2014-06-21 ENCOUNTER — Other Ambulatory Visit: Payer: Self-pay | Admitting: *Deleted

## 2014-06-21 DIAGNOSIS — C50912 Malignant neoplasm of unspecified site of left female breast: Secondary | ICD-10-CM

## 2014-06-22 ENCOUNTER — Ambulatory Visit
Admission: RE | Admit: 2014-06-22 | Discharge: 2014-06-22 | Disposition: A | Discharge status: Home / Self Care | Payer: Medicare Other | Source: Ambulatory Visit | Attending: Oncology | Admitting: Oncology

## 2014-06-22 ENCOUNTER — Other Ambulatory Visit: Payer: Self-pay | Admitting: Oncology

## 2014-06-22 DIAGNOSIS — Z08 Encounter for follow-up examination after completed treatment for malignant neoplasm: Secondary | ICD-10-CM | POA: Insufficient documentation

## 2014-06-22 DIAGNOSIS — Z853 Personal history of malignant neoplasm of breast: Secondary | ICD-10-CM | POA: Insufficient documentation

## 2014-06-22 DIAGNOSIS — C50912 Malignant neoplasm of unspecified site of left female breast: Secondary | ICD-10-CM

## 2014-06-22 DIAGNOSIS — K769 Liver disease, unspecified: Secondary | ICD-10-CM | POA: Diagnosis not present

## 2014-06-22 HISTORY — DX: Essential (primary) hypertension: I10

## 2014-06-22 MED ORDER — IOHEXOL 300 MG/ML  SOLN
100.0000 mL | Freq: Once | INTRAMUSCULAR | Status: AC | PRN
  Administered 2014-06-22: 100 mL via INTRAVENOUS

## 2014-06-23 ENCOUNTER — Other Ambulatory Visit: Payer: Medicare Other

## 2014-06-24 ENCOUNTER — Inpatient Hospital Stay: Payer: Medicare Other | Attending: Oncology

## 2014-06-24 ENCOUNTER — Inpatient Hospital Stay (HOSPITAL_BASED_OUTPATIENT_CLINIC_OR_DEPARTMENT_OTHER): Payer: Medicare Other | Admitting: Oncology

## 2014-06-24 ENCOUNTER — Inpatient Hospital Stay: Payer: Medicare Other

## 2014-06-24 VITALS — BP 142/89 | HR 83 | Temp 95.8°F | Ht 60.0 in | Wt 175.5 lb

## 2014-06-24 DIAGNOSIS — M069 Rheumatoid arthritis, unspecified: Secondary | ICD-10-CM

## 2014-06-24 DIAGNOSIS — C50919 Malignant neoplasm of unspecified site of unspecified female breast: Secondary | ICD-10-CM

## 2014-06-24 DIAGNOSIS — C50911 Malignant neoplasm of unspecified site of right female breast: Secondary | ICD-10-CM | POA: Insufficient documentation

## 2014-06-24 DIAGNOSIS — Z5111 Encounter for antineoplastic chemotherapy: Secondary | ICD-10-CM | POA: Diagnosis not present

## 2014-06-24 DIAGNOSIS — N281 Cyst of kidney, acquired: Secondary | ICD-10-CM | POA: Insufficient documentation

## 2014-06-24 DIAGNOSIS — R918 Other nonspecific abnormal finding of lung field: Secondary | ICD-10-CM | POA: Insufficient documentation

## 2014-06-24 DIAGNOSIS — Z171 Estrogen receptor negative status [ER-]: Secondary | ICD-10-CM

## 2014-06-24 DIAGNOSIS — K121 Other forms of stomatitis: Secondary | ICD-10-CM | POA: Diagnosis not present

## 2014-06-24 DIAGNOSIS — I10 Essential (primary) hypertension: Secondary | ICD-10-CM | POA: Insufficient documentation

## 2014-06-24 DIAGNOSIS — Z800 Family history of malignant neoplasm of digestive organs: Secondary | ICD-10-CM | POA: Diagnosis not present

## 2014-06-24 DIAGNOSIS — Z79899 Other long term (current) drug therapy: Secondary | ICD-10-CM | POA: Diagnosis not present

## 2014-06-24 DIAGNOSIS — C779 Secondary and unspecified malignant neoplasm of lymph node, unspecified: Secondary | ICD-10-CM

## 2014-06-24 DIAGNOSIS — C7800 Secondary malignant neoplasm of unspecified lung: Secondary | ICD-10-CM

## 2014-06-24 DIAGNOSIS — C50912 Malignant neoplasm of unspecified site of left female breast: Secondary | ICD-10-CM

## 2014-06-24 DIAGNOSIS — C787 Secondary malignant neoplasm of liver and intrahepatic bile duct: Secondary | ICD-10-CM | POA: Insufficient documentation

## 2014-06-24 DIAGNOSIS — R531 Weakness: Secondary | ICD-10-CM | POA: Insufficient documentation

## 2014-06-24 DIAGNOSIS — G9589 Other specified diseases of spinal cord: Secondary | ICD-10-CM | POA: Diagnosis not present

## 2014-06-24 DIAGNOSIS — R5383 Other fatigue: Secondary | ICD-10-CM | POA: Diagnosis not present

## 2014-06-24 LAB — COMPREHENSIVE METABOLIC PANEL
ALBUMIN: 3.2 g/dL — AB (ref 3.5–5.0)
ALT: 48 U/L (ref 14–54)
AST: 74 U/L — AB (ref 15–41)
Alkaline Phosphatase: 162 U/L — ABNORMAL HIGH (ref 38–126)
Anion gap: 4 — ABNORMAL LOW (ref 5–15)
BUN: 12 mg/dL (ref 6–20)
CALCIUM: 8.8 mg/dL — AB (ref 8.9–10.3)
CO2: 26 mmol/L (ref 22–32)
Chloride: 103 mmol/L (ref 101–111)
Creatinine, Ser: 0.79 mg/dL (ref 0.44–1.00)
GFR calc Af Amer: >60 mL/min (ref >60)
GFR calc non Af Amer: >60 mL/min (ref >60)
GLUCOSE: 110 mg/dL — AB (ref 65–99)
Potassium: 3.8 mmol/L (ref 3.5–5.1)
SODIUM: 133 mmol/L — AB (ref 135–145)
Total Bilirubin: 0.3 mg/dL (ref 0.3–1.2)
Total Protein: 7.9 g/dL (ref 6.5–8.1)

## 2014-06-24 LAB — CBC WITH DIFFERENTIAL/PLATELET
Basophils Absolute: 0 10*3/uL (ref 0–0.1)
Basophils Relative: 1 %
Eosinophils Absolute: 0 10*3/uL (ref 0–0.7)
Eosinophils Relative: 1 %
HEMATOCRIT: 26.1 % — AB (ref 35.0–47.0)
Hemoglobin: 8.7 g/dL — ABNORMAL LOW (ref 12.0–16.0)
LYMPHS ABS: 1.8 10*3/uL (ref 1.0–3.6)
LYMPHS PCT: 35 %
MCH: 34.1 pg — ABNORMAL HIGH (ref 26.0–34.0)
MCHC: 33.1 g/dL (ref 32.0–36.0)
MCV: 103 fL — ABNORMAL HIGH (ref 80.0–100.0)
Monocytes Absolute: 0.8 10*3/uL (ref 0.2–0.9)
Monocytes Relative: 14 %
NEUTROS PCT: 49 %
Neutro Abs: 2.6 10*3/uL (ref 1.4–6.5)
PLATELETS: 160 10*3/uL (ref 150–440)
RBC: 2.53 MIL/uL — AB (ref 3.80–5.20)
RDW: 16.5 % — ABNORMAL HIGH (ref 11.5–14.5)
WBC: 5.3 10*3/uL (ref 3.6–11.0)

## 2014-06-24 MED ORDER — HEPARIN SOD (PORK) LOCK FLUSH 100 UNIT/ML IV SOLN
INTRAVENOUS | Status: AC
  Filled 2014-06-24: qty 5

## 2014-06-24 MED ORDER — SODIUM CHLORIDE 0.9 % IJ SOLN
10.0000 mL | Freq: Once | INTRAMUSCULAR | Status: AC
  Administered 2014-06-24: 10 mL via INTRAVENOUS
  Filled 2014-06-24: qty 10

## 2014-06-24 MED ORDER — LIDOCAINE-PRILOCAINE 2.5-2.5 % EX CREA: 1.0000 "application " | TOPICAL_CREAM | CUTANEOUS | Status: AC | PRN

## 2014-06-24 MED ORDER — HEPARIN SOD (PORK) LOCK FLUSH 100 UNIT/ML IV SOLN
500.0000 [IU] | Freq: Once | INTRAVENOUS | Status: AC
  Administered 2014-06-24: 500 [IU] via INTRAVENOUS

## 2014-06-25 ENCOUNTER — Encounter: Payer: Self-pay | Admitting: Oncology

## 2014-06-25 DIAGNOSIS — C50912 Malignant neoplasm of unspecified site of left female breast: Secondary | ICD-10-CM | POA: Insufficient documentation

## 2014-06-25 HISTORY — DX: Malignant neoplasm of unspecified site of left female breast: C50.912

## 2014-06-25 LAB — CANCER ANTIGEN 27.29: CA 27.29: 142.9 U/mL — ABNORMAL HIGH (ref 0.0–38.6)

## 2014-06-25 NOTE — Progress Notes (Signed)
[place] @ [facility] [phone]  Fax:([phone]) [phone]     Jaianna Nicoll OB: [birth year]  MR#: [id number]  LTJ#:[id number]  Patient Care Team: Jodi Marble, MD as PCP - General (Internal Medicine) Forest Gleason, MD (Unknown Physician Specialty) Christene Lye, MD (General Surgery)  CHIEF COMPLAINT:  Chief Complaint  Patient presents with  . Follow-up    Breast Cancer    Oncology History   1. Carcinoma of breast (left) diagnosis on June 14, by a core needle biopsy and lymph node biopsy. Estrogen receptor negative, progesterone receptor negative, HER-2 receptor negative. 2. Status post lumpectomy (January, 2012), 0.9 cm tumor, one positive lymph node.  Margins may be involved. AJCC Staging: [id number] Stage Grouping: status postchemotherapy and radiation treatment. 3.  MyRisk genetic mutation study is negative for any mutation. 4.  Patient also has had due to Rheumatoid arthritis on methotrexate tablet. 5.  PET scan shows progressive disease with multiple liver metastases and lung metastases (November, 18th, 2015)  6.  Patient was started on chemotherapy with carboplatinum /abraxene  day 1 day 8 schedule biopsy from jugular lymph node  was positive for metastatic breast cancer.         Cancer of left breast, stage 4   06/25/2014 Initial Diagnosis Cancer of left breast, stage 4    No flowsheet data found.  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  Has finished total 6 cycles of chemotherapy had a severe reaction to carboplatinum during last treatment.  Had a repeat PET scan done.  Continues to feel weak and tired appetite is poor patient also has rheumatoid arthritis.Not.lost any significant weight REVIEW OF SYSTEMS:   Review of Systems  Constitutional: Positive for malaise/fatigue. Negative for fever, chills, weight loss and diaphoresis.  HENT: Negative for congestion,  ear discharge, ear pain, hearing loss, nosebleeds, sore throat and tinnitus.   Eyes: Negative for blurred vision, double vision, photophobia, pain, discharge and redness.  Respiratory: Positive for cough. Negative for hemoptysis, sputum production, shortness of breath, wheezing and stridor.   Cardiovascular: Negative for chest pain, palpitations, orthopnea, claudication, leg swelling and PND.  Gastrointestinal: Negative for heartburn, nausea, vomiting, abdominal pain, diarrhea, constipation, blood in stool and melena.  Genitourinary: Negative for dysuria, urgency, frequency, hematuria and flank pain.  Musculoskeletal: Negative for myalgias, back pain, joint pain, falls and neck pain.  Skin: Negative for itching and rash.  Neurological: Positive for weakness. Negative for dizziness, tingling, tremors, sensory change, speech change, focal weakness, seizures, loss of consciousness and headaches.  Endo/Heme/Allergies: Negative for environmental allergies and polydipsia. Does not bruise/bleed easily.  Psychiatric/Behavioral: Negative for depression, suicidal ideas, hallucinations, memory loss and substance abuse. The patient is not nervous/anxious and does not have insomnia.   All other systems reviewed and are negative.   As per HPI. Otherwise, a complete review of systems is negatve.  PAST MEDICAL HISTORY: Past Medical History  Diagnosis Date  . Rheumatoid arthritis   . Cancer 2012    breast   . Breast cancer   . Hypertension   . Cancer of left breast, stage 4 06/25/2014    PAST SURGICAL HISTORY: Past Surgical History  Procedure Laterality Date  . Breast surgery  2010    lumpectomy  . Tubal ligation  1993  . Carpal tunnel release Bilateral   . Portacath placement  01/19/14    FAMILY HISTORY Family History  Problem Relation Age of Onset  . Cancer Father  colon        ADVANCED DIRECTIVES:  patient was advised and discuss her living will.  All available resources were  offered   HEALTH MAINTENANCE: History  Substance Use Topics  . Smoking status: Never Smoker   . Smokeless tobacco: Never Used  . Alcohol Use: No      Allergies  Allergen Reactions  . Carboplatin Shortness Of Breath    Other reaction(s): Tight chest (finding)    Current Outpatient Prescriptions  Medication Sig Dispense Refill  . Ferrous Sulfate (SLOW FE) 142 (45 FE) MG TBCR Take 1 tablet by mouth daily.    . fluticasone (FLONASE) 50 MCG/ACT nasal spray 1 spray as needed for allergies.   0  . folic acid (FOLVITE) 1 MG tablet Take 1 mg by mouth daily.    . hydrochlorothiazide (MICROZIDE) 12.5 MG capsule Take 12.5 mg by mouth daily.    Marland Kitchen loratadine (CLARITIN) 10 MG tablet Take 10 mg by mouth daily.    . pantoprazole (PROTONIX) 40 MG tablet Take 40 mg by mouth daily.    . promethazine (PHENERGAN) 25 MG tablet Take 25 mg by mouth every 6 (six) hours as needed for nausea or vomiting.    . traMADol (ULTRAM) 50 MG tablet Take 50 mg by mouth every 6 (six) hours as needed for moderate pain.    Marland Kitchen amoxicillin (AMOXIL) 875 MG tablet Take 875 mg by mouth 2 (two) times daily.  0  . hydroxychloroquine (PLAQUENIL) 200 MG tablet Take 200 mg by mouth daily.    Marland Kitchen ibuprofen (ADVIL,MOTRIN) 800 MG tablet Take 800 mg by mouth every 8 (eight) hours as needed.    . lidocaine-prilocaine (EMLA) cream Apply 1 application topically as needed. 30 min prior to accessing port 30 g 3   No current facility-administered medications for this visit.    OBJECTIVE: Filed Vitals:   06/24/14 1111  BP: 142/89  Pulse: 83  Temp: 95.8 F (35.4 C)     Body mass index is 34.27 kg/(m^2).    ECOG FS:0 - Asymptomatic  Physical Exam   LAB RESULTS:     Component Value Date/Time   NA 133* 06/24/2014 1052   NA 131* 06/03/2014 0947   K 3.8 06/24/2014 1052   K 3.5 06/03/2014 0947   CL 103 06/24/2014 1052   CL 102 06/03/2014 0947   CO2 26 06/24/2014 1052   CO2 23 06/03/2014 0947   GLUCOSE 110* 06/24/2014 1052    GLUCOSE 131* 06/03/2014 0947   BUN 12 06/24/2014 1052   BUN 12 06/03/2014 0947   CREATININE 0.79 06/24/2014 1052   CREATININE 0.88 06/03/2014 0947   CALCIUM 8.8* 06/24/2014 1052   CALCIUM 8.8* 06/03/2014 0947   PROT 7.9 06/24/2014 1052   PROT 7.8 06/03/2014 0947   ALBUMIN 3.2* 06/24/2014 1052   ALBUMIN 3.3* 06/03/2014 0947   AST 74* 06/24/2014 1052   AST 74* 06/03/2014 0947   ALT 48 06/24/2014 1052   ALT 47 06/03/2014 0947   ALKPHOS 162* 06/24/2014 1052   ALKPHOS 146* 06/03/2014 0947   BILITOT 0.3 06/24/2014 1052   GFRNONAA >60 06/24/2014 1052   GFRNONAA >60 06/03/2014 0947   GFRAA >60 06/24/2014 1052   GFRAA >60 06/03/2014 0947    No results found for: SPEP, UPEP  Lab Results  Component Value Date   WBC 5.3 06/24/2014   NEUTROABS 2.6 06/24/2014   HGB 8.7* 06/24/2014   HCT 26.1* 06/24/2014   MCV 103.0* 06/24/2014   PLT 160 06/24/2014  Chemistry      Component Value Date/Time   NA 133* 06/24/2014 1052   NA 131* 06/03/2014 0947   K 3.8 06/24/2014 1052   K 3.5 06/03/2014 0947   CL 103 06/24/2014 1052   CL 102 06/03/2014 0947   CO2 26 06/24/2014 1052   CO2 23 06/03/2014 0947   BUN 12 06/24/2014 1052   BUN 12 06/03/2014 0947   CREATININE 0.79 06/24/2014 1052   CREATININE 0.88 06/03/2014 0947      Component Value Date/Time   CALCIUM 8.8* 06/24/2014 1052   CALCIUM 8.8* 06/03/2014 0947   ALKPHOS 162* 06/24/2014 1052   ALKPHOS 146* 06/03/2014 0947   AST 74* 06/24/2014 1052   AST 74* 06/03/2014 0947   ALT 48 06/24/2014 1052   ALT 47 06/03/2014 0947   BILITOT 0.3 06/24/2014 1052       Lab Results  Component Value Date   LABCA2 142.9* 06/24/2014    No components found for: [id number]  No results for input(s): INR in the last 168 hours.  No results found for: COLORURINE, APPEARANCEUR, LABSPEC, PHURINE, GLUCOSEU, HGBUR, BILIRUBINUR, KETONESUR, PROTEINUR, UROBILINOGEN, NITRITE, LEUKOCYTESUR  STUDIES: Ct Chest W Contrast  06/22/2014   CLINICAL DATA:   Restaging of left breast cancer. Currently on chemotherapy. Left lumpectomy in 2012 with breast reduction.  EXAM: CT CHEST AND ABDOMEN WITH CONTRAST  TECHNIQUE: Multidetector CT imaging of the chest and abdomen was performed following the standard protocol during bolus administration of intravenous contrast.  CONTRAST:  127m OMNIPAQUE IOHEXOL 300 MG/ML  SOLN  COMPARISON:  Chest radiograph 01/19/2014. PET of 01/04/2014. No prior diagnostic CTs.  FINDINGS: CT CHEST FINDINGS  Mediastinum/Nodes: No supraclavicular adenopathy. A right-sided Port-A-Cath which terminates at the right ventricle.  Surgical changes involving the left breast with mild skin thickening, similar.  No axillary adenopathy. Bovine arch. Normal heart size, without pericardial effusion. No central pulmonary embolism, on this non-dedicated study.  No middle mediastinal or hilar adenopathy. The hypermetabolic prevascular and high anterior right mediastinal nodes have resolved. No residual hilar adenopathy is seen.  No internal mammary adenopathy.  Lungs/Pleura: No pleural fluid. Left upper lobe pulmonary nodule measures 3 mm on image 15 versus 7 mm on the prior (when remeasured).  Superior segment left lower lobe nodule measures 5 mm on image 20 versus 9 mm on the prior exam.  Other nodules have undergone similar regression. No enlarging or new nodules identified.  Musculoskeletal: Interval sclerosis at the site of a sternal manubrial osseous lytic lesion on the prior exam.  CT ABDOMEN FINDINGS  Hepatobiliary: Hepatic metastasis. Direct comparison to prior MRI difficult. Metastatic burden overall felt to be decreased. Ill-defined residual hypo attenuating foci are seen throughout the medial segment left in the anterior segment right lobe. Index medial segment left liver lobe 12 mm lesion on image 47. Right hepatic dome 9 mm lesion on image 44.  Normal gallbladder, without biliary ductal dilatation.  Pancreas: Normal, without mass or ductal dilatation.   Spleen: Normal  Adrenals/Urinary Tract: Normal adrenal glands. Upper pole 3.2 cm right renal cyst. Normal left kidney, without hydronephrosis.  Stomach/Bowel: Normal stomach, without wall thickening. Normal abdominal large and small bowel loops.  Vascular/Lymphatic: Normal caliber of the aorta and branch vessels. No retroperitoneal or retrocrural adenopathy. No residual porta hepatis adenopathy identified.  Other: No ascites.  No evidence of omental or peritoneal disease.  Musculoskeletal: Interval sclerosis at the site of previously described lytic lesions, including at the L3 and L4 vertebral bodies.  IMPRESSION: 1.  Response to therapy of thoracic nodal and pulmonary metastasis. 2. Hepatic metastatic burden is difficult to evaluate secondary to cross modality comparison from prior PET. Overall, felt to be decreased. 3. Sclerosis at the site of previously described lytic osseous metastasis, likely due to healing. 4. No sites of new or progressive disease.   Electronically Signed   By: Abigail Miyamoto M.D.   On: 06/22/2014 09:39   Ct Abdomen W Contrast  06/22/2014   CLINICAL DATA:  Restaging of left breast cancer. Currently on chemotherapy. Left lumpectomy in 2012 with breast reduction.  EXAM: CT CHEST AND ABDOMEN WITH CONTRAST  TECHNIQUE: Multidetector CT imaging of the chest and abdomen was performed following the standard protocol during bolus administration of intravenous contrast.  CONTRAST:  140m OMNIPAQUE IOHEXOL 300 MG/ML  SOLN  COMPARISON:  Chest radiograph 01/19/2014. PET of 01/04/2014. No prior diagnostic CTs.  FINDINGS: CT CHEST FINDINGS  Mediastinum/Nodes: No supraclavicular adenopathy. A right-sided Port-A-Cath which terminates at the right ventricle.  Surgical changes involving the left breast with mild skin thickening, similar.  No axillary adenopathy. Bovine arch. Normal heart size, without pericardial effusion. No central pulmonary embolism, on this non-dedicated study.  No middle mediastinal or  hilar adenopathy. The hypermetabolic prevascular and high anterior right mediastinal nodes have resolved. No residual hilar adenopathy is seen.  No internal mammary adenopathy.  Lungs/Pleura: No pleural fluid. Left upper lobe pulmonary nodule measures 3 mm on image 15 versus 7 mm on the prior (when remeasured).  Superior segment left lower lobe nodule measures 5 mm on image 20 versus 9 mm on the prior exam.  Other nodules have undergone similar regression. No enlarging or new nodules identified.  Musculoskeletal: Interval sclerosis at the site of a sternal manubrial osseous lytic lesion on the prior exam.  CT ABDOMEN FINDINGS  Hepatobiliary: Hepatic metastasis. Direct comparison to prior MRI difficult. Metastatic burden overall felt to be decreased. Ill-defined residual hypo attenuating foci are seen throughout the medial segment left in the anterior segment right lobe. Index medial segment left liver lobe 12 mm lesion on image 47. Right hepatic dome 9 mm lesion on image 44.  Normal gallbladder, without biliary ductal dilatation.  Pancreas: Normal, without mass or ductal dilatation.  Spleen: Normal  Adrenals/Urinary Tract: Normal adrenal glands. Upper pole 3.2 cm right renal cyst. Normal left kidney, without hydronephrosis.  Stomach/Bowel: Normal stomach, without wall thickening. Normal abdominal large and small bowel loops.  Vascular/Lymphatic: Normal caliber of the aorta and branch vessels. No retroperitoneal or retrocrural adenopathy. No residual porta hepatis adenopathy identified.  Other: No ascites.  No evidence of omental or peritoneal disease.  Musculoskeletal: Interval sclerosis at the site of previously described lytic lesions, including at the L3 and L4 vertebral bodies.  IMPRESSION: 1. Response to therapy of thoracic nodal and pulmonary metastasis. 2. Hepatic metastatic burden is difficult to evaluate secondary to cross modality comparison from prior PET. Overall, felt to be decreased. 3. Sclerosis at  the site of previously described lytic osseous metastasis, likely due to healing. 4. No sites of new or progressive disease.   Electronically Signed   By: KAbigail MiyamotoM.D.   On: 06/22/2014 09:39    ASSESSMENT and PlAN:  Stage 4  carcinoma of breast.  PET scan has been reviewed shows stable disease markers are slightly high.  Patient had severe   reaction to carboplatinum.  Possibility of changing chemotherapy to  Gemcitabine   And taxol    V.s. eribulin needs to be considered  Forest Gleason, MD   06/25/2014 1:39 PM

## 2014-06-30 ENCOUNTER — Inpatient Hospital Stay: Payer: Medicare Other

## 2014-06-30 VITALS — BP 105/75 | HR 108 | Temp 98.4°F | Resp 20

## 2014-06-30 DIAGNOSIS — C50912 Malignant neoplasm of unspecified site of left female breast: Secondary | ICD-10-CM

## 2014-06-30 DIAGNOSIS — Z5111 Encounter for antineoplastic chemotherapy: Secondary | ICD-10-CM | POA: Diagnosis not present

## 2014-06-30 MED ORDER — SODIUM CHLORIDE 0.9 % IJ SOLN
10.0000 mL | INTRAMUSCULAR | Status: DC | PRN
  Administered 2014-06-30: 10 mL
  Filled 2014-06-30: qty 10

## 2014-06-30 MED ORDER — HEPARIN SOD (PORK) LOCK FLUSH 100 UNIT/ML IV SOLN
500.0000 [IU] | Freq: Once | INTRAVENOUS | Status: AC | PRN
  Administered 2014-06-30: 500 [IU]

## 2014-06-30 MED ORDER — SODIUM CHLORIDE 0.9 % IV SOLN
Freq: Once | INTRAVENOUS | Status: AC
  Administered 2014-06-30: 15:00:00 via INTRAVENOUS
  Filled 2014-06-30: qty 250

## 2014-06-30 MED ORDER — HEPARIN SOD (PORK) LOCK FLUSH 100 UNIT/ML IV SOLN
INTRAVENOUS | Status: AC
  Filled 2014-06-30: qty 5

## 2014-06-30 MED ORDER — ERIBULIN MESYLATE CHEMO INJECTION 1 MG/2ML
1.4000 mg/m2 | Freq: Once | INTRAVENOUS | Status: AC
  Administered 2014-06-30: 2.6 mg via INTRAVENOUS
  Filled 2014-06-30: qty 5.2

## 2014-06-30 MED ORDER — SODIUM CHLORIDE 0.9 % IV SOLN
Freq: Once | INTRAVENOUS | Status: AC
  Administered 2014-06-30: 16:00:00 via INTRAVENOUS
  Filled 2014-06-30: qty 4

## 2014-07-02 ENCOUNTER — Ambulatory Visit: Payer: Medicare Other

## 2014-07-07 ENCOUNTER — Other Ambulatory Visit: Payer: Medicare Other

## 2014-07-08 ENCOUNTER — Inpatient Hospital Stay: Payer: Medicare Other

## 2014-07-08 ENCOUNTER — Telehealth: Payer: Self-pay | Admitting: *Deleted

## 2014-07-08 DIAGNOSIS — C50919 Malignant neoplasm of unspecified site of unspecified female breast: Secondary | ICD-10-CM

## 2014-07-08 DIAGNOSIS — Z5111 Encounter for antineoplastic chemotherapy: Secondary | ICD-10-CM | POA: Diagnosis not present

## 2014-07-08 LAB — CBC WITH DIFFERENTIAL/PLATELET
BASOS ABS: 0 10*3/uL (ref 0–0.1)
Basophils Relative: 1 %
EOS ABS: 0 10*3/uL (ref 0–0.7)
HCT: 27.8 % — ABNORMAL LOW (ref 35.0–47.0)
Hemoglobin: 9.3 g/dL — ABNORMAL LOW (ref 12.0–16.0)
Lymphocytes Relative: 58 %
Lymphs Abs: 1.2 10*3/uL (ref 1.0–3.6)
MCH: 33.1 pg (ref 26.0–34.0)
MCHC: 33.3 g/dL (ref 32.0–36.0)
MCV: 99.3 fL (ref 80.0–100.0)
Monocytes Absolute: 0.7 10*3/uL (ref 0.2–0.9)
Monocytes Relative: 38 %
Neutro Abs: 0.1 10*3/uL — ABNORMAL LOW (ref 1.4–6.5)
Neutrophils Relative %: 3 %
PLATELETS: 244 10*3/uL (ref 150–440)
RBC: 2.79 MIL/uL — ABNORMAL LOW (ref 3.80–5.20)
RDW: 15.1 % — AB (ref 11.5–14.5)
WBC: 2 10*3/uL — ABNORMAL LOW (ref 3.6–11.0)

## 2014-07-08 MED ORDER — LEVOFLOXACIN 500 MG PO TABS
500.0000 mg | ORAL_TABLET | Freq: Every day | ORAL | Status: DC
Start: 2014-07-08 — End: 2014-07-15

## 2014-07-08 NOTE — Telephone Encounter (Signed)
Called and informed pt that blood counts are low today and Dr. Oliva Bustard would like for patient to start taking levaquin 500mg  daily for 5 days to prevent infection. Informed pt that prescription will be called into pharmacy and if develops fever, pt was instructed to call our office to schedule an appointment for MD to see patient. Pt verbalized understanding.

## 2014-07-09 ENCOUNTER — Other Ambulatory Visit: Payer: Self-pay | Admitting: *Deleted

## 2014-07-09 DIAGNOSIS — C50912 Malignant neoplasm of unspecified site of left female breast: Secondary | ICD-10-CM

## 2014-07-14 ENCOUNTER — Ambulatory Visit: Payer: Medicare Other | Admitting: Oncology

## 2014-07-14 ENCOUNTER — Other Ambulatory Visit: Payer: Medicare Other

## 2014-07-14 ENCOUNTER — Ambulatory Visit: Payer: Medicare Other

## 2014-07-15 ENCOUNTER — Inpatient Hospital Stay (HOSPITAL_BASED_OUTPATIENT_CLINIC_OR_DEPARTMENT_OTHER): Payer: Medicare Other | Admitting: Oncology

## 2014-07-15 ENCOUNTER — Inpatient Hospital Stay: Payer: Medicare Other

## 2014-07-15 VITALS — BP 113/78 | HR 108 | Temp 95.7°F | Wt 168.4 lb

## 2014-07-15 DIAGNOSIS — R531 Weakness: Secondary | ICD-10-CM

## 2014-07-15 DIAGNOSIS — Z171 Estrogen receptor negative status [ER-]: Secondary | ICD-10-CM | POA: Diagnosis not present

## 2014-07-15 DIAGNOSIS — C787 Secondary malignant neoplasm of liver and intrahepatic bile duct: Secondary | ICD-10-CM

## 2014-07-15 DIAGNOSIS — C7800 Secondary malignant neoplasm of unspecified lung: Secondary | ICD-10-CM | POA: Diagnosis not present

## 2014-07-15 DIAGNOSIS — C50912 Malignant neoplasm of unspecified site of left female breast: Secondary | ICD-10-CM

## 2014-07-15 DIAGNOSIS — Z79899 Other long term (current) drug therapy: Secondary | ICD-10-CM

## 2014-07-15 DIAGNOSIS — R918 Other nonspecific abnormal finding of lung field: Secondary | ICD-10-CM

## 2014-07-15 DIAGNOSIS — G9589 Other specified diseases of spinal cord: Secondary | ICD-10-CM

## 2014-07-15 DIAGNOSIS — C779 Secondary and unspecified malignant neoplasm of lymph node, unspecified: Secondary | ICD-10-CM

## 2014-07-15 DIAGNOSIS — Z5111 Encounter for antineoplastic chemotherapy: Secondary | ICD-10-CM | POA: Diagnosis not present

## 2014-07-15 DIAGNOSIS — Z800 Family history of malignant neoplasm of digestive organs: Secondary | ICD-10-CM

## 2014-07-15 DIAGNOSIS — M069 Rheumatoid arthritis, unspecified: Secondary | ICD-10-CM

## 2014-07-15 DIAGNOSIS — I10 Essential (primary) hypertension: Secondary | ICD-10-CM

## 2014-07-15 DIAGNOSIS — K121 Other forms of stomatitis: Secondary | ICD-10-CM

## 2014-07-15 DIAGNOSIS — N281 Cyst of kidney, acquired: Secondary | ICD-10-CM

## 2014-07-15 DIAGNOSIS — R5383 Other fatigue: Secondary | ICD-10-CM

## 2014-07-15 DIAGNOSIS — C50919 Malignant neoplasm of unspecified site of unspecified female breast: Secondary | ICD-10-CM

## 2014-07-15 LAB — CBC WITH DIFFERENTIAL/PLATELET
BASOS PCT: 0 %
Basophils Absolute: 0 10*3/uL (ref 0–0.1)
EOS ABS: 0 10*3/uL (ref 0–0.7)
Eosinophils Relative: 0 %
HEMATOCRIT: 26.8 % — AB (ref 35.0–47.0)
Hemoglobin: 8.8 g/dL — ABNORMAL LOW (ref 12.0–16.0)
Lymphocytes Relative: 47 %
Lymphs Abs: 1.9 10*3/uL (ref 1.0–3.6)
MCH: 32.8 pg (ref 26.0–34.0)
MCHC: 33 g/dL (ref 32.0–36.0)
MCV: 99.6 fL (ref 80.0–100.0)
MONO ABS: 0.8 10*3/uL (ref 0.2–0.9)
MONOS PCT: 20 %
NEUTROS PCT: 33 %
Neutro Abs: 1.3 10*3/uL — ABNORMAL LOW (ref 1.4–6.5)
PLATELETS: 228 10*3/uL (ref 150–440)
RBC: 2.69 MIL/uL — AB (ref 3.80–5.20)
RDW: 16 % — ABNORMAL HIGH (ref 11.5–14.5)
WBC: 3.9 10*3/uL (ref 3.6–11.0)

## 2014-07-15 LAB — COMPREHENSIVE METABOLIC PANEL
ALT: 21 U/L (ref 14–54)
AST: 49 U/L — AB (ref 15–41)
Albumin: 3.1 g/dL — ABNORMAL LOW (ref 3.5–5.0)
Alkaline Phosphatase: 91 U/L (ref 38–126)
Anion gap: 6 (ref 5–15)
BUN: 12 mg/dL (ref 6–20)
CHLORIDE: 97 mmol/L — AB (ref 101–111)
CO2: 28 mmol/L (ref 22–32)
Calcium: 8.6 mg/dL — ABNORMAL LOW (ref 8.9–10.3)
Creatinine, Ser: 0.94 mg/dL (ref 0.44–1.00)
GFR calc Af Amer: >60 mL/min (ref >60)
GFR calc non Af Amer: >60 mL/min (ref >60)
Glucose, Bld: 115 mg/dL — ABNORMAL HIGH (ref 65–99)
Potassium: 2.7 mmol/L — CL (ref 3.5–5.1)
SODIUM: 131 mmol/L — AB (ref 135–145)
TOTAL PROTEIN: 7.6 g/dL (ref 6.5–8.1)
Total Bilirubin: 0.4 mg/dL (ref 0.3–1.2)

## 2014-07-15 LAB — MAGNESIUM: Magnesium: 1.6 mg/dL — ABNORMAL LOW (ref 1.7–2.4)

## 2014-07-15 MED ORDER — SODIUM CHLORIDE 0.9 % IV SOLN
INTRAVENOUS | Status: DC
  Administered 2014-07-15: 16:00:00 via INTRAVENOUS
  Filled 2014-07-15: qty 100

## 2014-07-15 MED ORDER — HEPARIN SOD (PORK) LOCK FLUSH 100 UNIT/ML IV SOLN
500.0000 [IU] | Freq: Once | INTRAVENOUS | Status: AC
  Administered 2014-07-15: 500 [IU] via INTRAVENOUS
  Filled 2014-07-15: qty 5

## 2014-07-15 MED ORDER — POTASSIUM CHLORIDE 20 MEQ/100ML IV SOLN: 20.0000 meq | Freq: Once | INTRAVENOUS | Status: DC

## 2014-07-15 MED ORDER — SODIUM CHLORIDE 0.9 % IV SOLN
Freq: Once | INTRAVENOUS | Status: AC
  Administered 2014-07-15: 17:00:00 via INTRAVENOUS
  Filled 2014-07-15: qty 4

## 2014-07-15 MED ORDER — SODIUM CHLORIDE 0.9 % IV SOLN
Freq: Once | INTRAVENOUS | Status: AC
  Administered 2014-07-15: 15:00:00 via INTRAVENOUS
  Filled 2014-07-15: qty 1000

## 2014-07-15 MED ORDER — ERIBULIN MESYLATE CHEMO INJECTION 1 MG/2ML
1.1000 mg/m2 | Freq: Once | INTRAVENOUS | Status: AC
  Administered 2014-07-15: 2 mg via INTRAVENOUS
  Filled 2014-07-15: qty 4

## 2014-07-15 MED ORDER — MAGNESIUM CHLORIDE 64 MG PO TBEC: 1.0000 | DELAYED_RELEASE_TABLET | Freq: Two times a day (BID) | ORAL | Status: DC

## 2014-07-15 MED ORDER — SODIUM CHLORIDE 0.9 % IJ SOLN
10.0000 mL | INTRAMUSCULAR | Status: DC | PRN
  Administered 2014-07-15: 10 mL via INTRAVENOUS
  Filled 2014-07-15: qty 10

## 2014-07-15 NOTE — Progress Notes (Signed)
Pt has never smoked.  Does not have living will.

## 2014-07-16 ENCOUNTER — Telehealth: Payer: Self-pay | Admitting: *Deleted

## 2014-07-16 ENCOUNTER — Encounter: Payer: Self-pay | Admitting: Oncology

## 2014-07-16 LAB — CANCER ANTIGEN 27.29: CA 27.29: 108.5 U/mL — ABNORMAL HIGH (ref 0.0–38.6)

## 2014-07-16 NOTE — Telephone Encounter (Signed)
Pt notified that prescription for magnesium BID has been called into pharmacy and can start taking as soon as possible. Pt verbalized understanding.

## 2014-07-16 NOTE — Progress Notes (Signed)
[place] @ [facility] Telephone:(336) [phone]  Fax:(336) [phone]     Beth Flores OB: 09-20-1960  MR#: [id number]  TML#:[id number]  Patient Care Team: Jodi Marble, MD as PCP - General (Internal Medicine) Forest Gleason, MD (Unknown Physician Specialty) Christene Lye, MD (General Surgery) Julieanne Manson Leeanne Mannan., MD as Consulting Physician (Rheumatology)  CHIEF COMPLAINT:  Chief Complaint  Patient presents with  . Follow-up    Oncology History   1. Carcinoma of breast (left) diagnosis on June 14, by a core needle biopsy and lymph node biopsy. Estrogen receptor negative, progesterone receptor negative, HER-2 receptor negative. 2. Status post lumpectomy (January, 2012), 0.9 cm tumor, one positive lymph node.  Margins may be involved. AJCC Staging: [id number]_0 Stage Grouping: status postchemotherapy and radiation treatment. 3.  MyRisk genetic mutation study is negative for any mutation. 4.  Patient also has had due to Rheumatoid arthritis on methotrexate tablet. 5.  PET scan shows progressive disease with multiple liver metastases and lung metastases (November, 18th, 2015)  6.  Patient was started on chemotherapy with carboplatinum /abraxene  day 1 day 8 schedule biopsy from jugular lymph node  was positive for metastatic breast cancer.         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 Patient could not get day 8 chemotherapy because of low blood count as well as upper respiratory tract infection.  Patient had some ulcers in the mouth.  But gradually improving.  No  soreness in the mouth.  General condition is improving.  Year for further evaluation and continuation of chemotherapy REVIEW OF SYSTEMS:   Review of Systems  Constitutional: Positive for malaise/fatigue. Negative for fever, chills, weight loss and diaphoresis.  HENT: Negative for congestion, ear discharge, ear pain, hearing loss, nosebleeds, sore throat and tinnitus.   Eyes: Negative for blurred vision, double vision, photophobia, pain, discharge and redness.  Respiratory: Negative for cough, hemoptysis, sputum production, shortness of breath, wheezing and stridor.   Cardiovascular: Positive for leg swelling. Negative for chest pain, palpitations, orthopnea, claudication and PND.  Gastrointestinal: Positive for heartburn. Negative for nausea, vomiting, abdominal pain, diarrhea, constipation, blood in stool and melena.  Genitourinary: Negative for dysuria, urgency, frequency, hematuria and flank pain.  Musculoskeletal: Negative for myalgias, back pain, joint pain, falls and neck pain.  Skin: Negative for itching and rash.  Neurological: Negative for dizziness, tingling, tremors, sensory change, speech change, focal weakness, seizures, loss of consciousness, weakness and headaches.  Endo/Heme/Allergies: Negative for environmental allergies and polydipsia. Does not bruise/bleed easily.  Psychiatric/Behavioral: Positive for depression. Negative for suicidal ideas, hallucinations, memory loss and substance abuse. The patient is not nervous/anxious and does not have insomnia.     As per HPI. Otherwise, a complete review of systems is negatve.  PAST MEDICAL HISTORY: Past Medical History  Diagnosis Date  . Rheumatoid arthritis   . Cancer 2012    breast   . Breast cancer   . Hypertension   . Cancer of left breast, stage 4 06/25/2014    PAST SURGICAL  HISTORY: Past Surgical History  Procedure Laterality Date  . Breast surgery  2010    lumpectomy  . Tubal ligation  1993  . Carpal tunnel release  Bilateral   . Portacath placement  01/19/14    FAMILY HISTORY Family History  Problem Relation Age of Onset  . Cancer Father     colon        ADVANCED DIRECTIVES:  patient was advised and discuss her living will.  All available resources were offered   HEALTH MAINTENANCE: History  Substance Use Topics  . Smoking status: Never Smoker   . Smokeless tobacco: Never Used  . Alcohol Use: No      Allergies  Allergen Reactions  . Carboplatin Shortness Of Breath    Other reaction(s): Tight chest (finding)    Current Outpatient Prescriptions  Medication Sig Dispense Refill  . Ferrous Sulfate (SLOW FE) 142 (45 FE) MG TBCR Take 1 tablet by mouth daily.    . fluticasone (FLONASE) 50 MCG/ACT nasal spray 1 spray as needed for allergies.   0  . folic acid (FOLVITE) 1 MG tablet Take 1 mg by mouth daily.    . hydrochlorothiazide (MICROZIDE) 12.5 MG capsule Take 12.5 mg by mouth daily.    . hydroxychloroquine (PLAQUENIL) 200 MG tablet Take 200 mg by mouth daily.    Marland Kitchen ibuprofen (ADVIL,MOTRIN) 800 MG tablet Take 800 mg by mouth every 8 (eight) hours as needed.    . lidocaine-prilocaine (EMLA) cream Apply 1 application topically as needed. 30 min prior to accessing port 30 g 3  . loratadine (CLARITIN) 10 MG tablet Take 10 mg by mouth daily.    . pantoprazole (PROTONIX) 40 MG tablet Take 40 mg by mouth daily.    . promethazine (PHENERGAN) 25 MG tablet Take 25 mg by mouth every 6 (six) hours as needed for nausea or vomiting.    . traMADol (ULTRAM) 50 MG tablet Take 50 mg by mouth every 6 (six) hours as needed for moderate pain.    Marland Kitchen levofloxacin (LEVAQUIN) 500 MG tablet Take 500 mg by mouth daily.  0  . magnesium chloride (SLOW-MAG) 64 MG TBEC SR tablet Take 1 tablet (64 mg total) by mouth 2 (two) times daily. 60 tablet 3   No current facility-administered medications for this visit.    OBJECTIVE: Filed Vitals:   07/15/14 1427  BP: 113/78  Pulse: 108  Temp: 95.7 F (35.4 C)     Body  mass index is 32.89 kg/(m^2).    ECOG FS:1 - Symptomatic but completely ambulatory  Physical Exam general status: Performance status is good.  Patient has not lost significant weight HEENT: No evidence of stomatitis. Sclera and conjunctivae :: No jaundice.   pale looking.alopecia Lungs: Air  entry equal on both sides.  No rhonchi.  No rales.  Cardiac: Heart sounds are normal.  No pericardial rub.  No murmur. Lymphatic system: Cervical, axillary, inguinal, lymph nodes not palpable GI: Abdomen is soft.  No ascites.  Liver spleen not palpable.  No tenderness.  Bowel sounds are within normal limit Lower extremity: No edema Neurological system: Higher functions, cranial nerves intact no evidence of peripheral neuropathy. Skin: No rash.  No ecchymosis.Marland Kitchen  LAB RESULTS:     Component Value Date/Time   NA 131* 07/15/2014 1414   NA 131* 06/03/2014 0947   K 2.7* 07/15/2014 1414   K 3.5 06/03/2014 0947   CL 97* 07/15/2014 1414   CL 102 06/03/2014 0947   CO2 28 07/15/2014 1414  CO2 23 06/03/2014 0947   GLUCOSE 115* 07/15/2014 1414   GLUCOSE 131* 06/03/2014 0947   BUN 12 07/15/2014 1414   BUN 12 06/03/2014 0947   CREATININE 0.94 07/15/2014 1414   CREATININE 0.88 06/03/2014 0947   CALCIUM 8.6* 07/15/2014 1414   CALCIUM 8.8* 06/03/2014 0947   PROT 7.6 07/15/2014 1414   PROT 7.8 06/03/2014 0947   ALBUMIN 3.1* 07/15/2014 1414   ALBUMIN 3.3* 06/03/2014 0947   AST 49* 07/15/2014 1414   AST 74* 06/03/2014 0947   ALT 21 07/15/2014 1414   ALT 47 06/03/2014 0947   ALKPHOS 91 07/15/2014 1414   ALKPHOS 146* 06/03/2014 0947   BILITOT 0.4 07/15/2014 1414   GFRNONAA >60 07/15/2014 1414   GFRNONAA >60 06/03/2014 0947   GFRAA >60 07/15/2014 1414   GFRAA >60 06/03/2014 0947    No results found for: SPEP, UPEP  Lab Results  Component Value Date   WBC 3.9 07/15/2014   NEUTROABS 1.3* 07/15/2014   HGB 8.8* 07/15/2014   HCT 26.8* 07/15/2014   MCV 99.6 07/15/2014   PLT 228 07/15/2014       Chemistry      Component Value Date/Time   NA 131* 07/15/2014 1414   NA 131* 06/03/2014 0947   K 2.7* 07/15/2014 1414   K 3.5 06/03/2014 0947   CL 97* 07/15/2014 1414   CL 102 06/03/2014 0947   CO2 28 07/15/2014 1414   CO2 23 06/03/2014 0947   BUN 12 07/15/2014 1414   BUN 12 06/03/2014 0947   CREATININE 0.94 07/15/2014 1414   CREATININE 0.88 06/03/2014 0947      Component Value Date/Time   CALCIUM 8.6* 07/15/2014 1414   CALCIUM 8.8* 06/03/2014 0947   ALKPHOS 91 07/15/2014 1414   ALKPHOS 146* 06/03/2014 0947   AST 49* 07/15/2014 1414   AST 74* 06/03/2014 0947   ALT 21 07/15/2014 1414   ALT 47 06/03/2014 0947   BILITOT 0.4 07/15/2014 1414       Lab Results  Component Value Date   LABCA2 108.5* 07/15/2014    No components found for: [id number]  No results for input(s): INR in the last 168 hours.  No results found for: COLORURINE, APPEARANCEUR, LABSPEC, PHURINE, GLUCOSEU, HGBUR, BILIRUBINUR, KETONESUR, PROTEINUR, UROBILINOGEN, NITRITE, LEUKOCYTESUR  STUDIES: Ct Chest W Contrast  06/22/2014   CLINICAL DATA:  Restaging of left breast cancer. Currently on chemotherapy. Left lumpectomy in 2012 with breast reduction.  EXAM: CT CHEST AND ABDOMEN WITH CONTRAST  TECHNIQUE: Multidetector CT imaging of the chest and abdomen was performed following the standard protocol during bolus administration of intravenous contrast.  CONTRAST:  148m OMNIPAQUE IOHEXOL 300 MG/ML  SOLN  COMPARISON:  Chest radiograph 01/19/2014. PET of 01/04/2014. No prior diagnostic CTs.  FINDINGS: CT CHEST FINDINGS  Mediastinum/Nodes: No supraclavicular adenopathy. A right-sided Port-A-Cath which terminates at the right ventricle.  Surgical changes involving the left breast with mild skin thickening, similar.  No axillary adenopathy. Bovine arch. Normal heart size, without pericardial effusion. No central pulmonary embolism, on this non-dedicated study.  No middle mediastinal or hilar adenopathy. The hypermetabolic  prevascular and high anterior right mediastinal nodes have resolved. No residual hilar adenopathy is seen.  No internal mammary adenopathy.  Lungs/Pleura: No pleural fluid. Left upper lobe pulmonary nodule measures 3 mm on image 15 versus 7 mm on the prior (when remeasured).  Superior segment left lower lobe nodule measures 5 mm on image 20 versus 9 mm on the prior exam.  Other nodules have  undergone similar regression. No enlarging or new nodules identified.  Musculoskeletal: Interval sclerosis at the site of a sternal manubrial osseous lytic lesion on the prior exam.  CT ABDOMEN FINDINGS  Hepatobiliary: Hepatic metastasis. Direct comparison to prior MRI difficult. Metastatic burden overall felt to be decreased. Ill-defined residual hypo attenuating foci are seen throughout the medial segment left in the anterior segment right lobe. Index medial segment left liver lobe 12 mm lesion on image 47. Right hepatic dome 9 mm lesion on image 44.  Normal gallbladder, without biliary ductal dilatation.  Pancreas: Normal, without mass or ductal dilatation.  Spleen: Normal  Adrenals/Urinary Tract: Normal adrenal glands. Upper pole 3.2 cm right renal cyst. Normal left kidney, without hydronephrosis.  Stomach/Bowel: Normal stomach, without wall thickening. Normal abdominal large and small bowel loops.  Vascular/Lymphatic: Normal caliber of the aorta and branch vessels. No retroperitoneal or retrocrural adenopathy. No residual porta hepatis adenopathy identified.  Other: No ascites.  No evidence of omental or peritoneal disease.  Musculoskeletal: Interval sclerosis at the site of previously described lytic lesions, including at the L3 and L4 vertebral bodies.  IMPRESSION: 1. Response to therapy of thoracic nodal and pulmonary metastasis. 2. Hepatic metastatic burden is difficult to evaluate secondary to cross modality comparison from prior PET. Overall, felt to be decreased. 3. Sclerosis at the site of previously described lytic  osseous metastasis, likely due to healing. 4. No sites of new or progressive disease.   Electronically Signed   By: Abigail Miyamoto M.D.   On: 06/22/2014 09:39   Ct Abdomen W Contrast  06/22/2014   CLINICAL DATA:  Restaging of left breast cancer. Currently on chemotherapy. Left lumpectomy in 2012 with breast reduction.  EXAM: CT CHEST AND ABDOMEN WITH CONTRAST  TECHNIQUE: Multidetector CT imaging of the chest and abdomen was performed following the standard protocol during bolus administration of intravenous contrast.  CONTRAST:  151m OMNIPAQUE IOHEXOL 300 MG/ML  SOLN  COMPARISON:  Chest radiograph 01/19/2014. PET of 01/04/2014. No prior diagnostic CTs.  FINDINGS: CT CHEST FINDINGS  Mediastinum/Nodes: No supraclavicular adenopathy. A right-sided Port-A-Cath which terminates at the right ventricle.  Surgical changes involving the left breast with mild skin thickening, similar.  No axillary adenopathy. Bovine arch. Normal heart size, without pericardial effusion. No central pulmonary embolism, on this non-dedicated study.  No middle mediastinal or hilar adenopathy. The hypermetabolic prevascular and high anterior right mediastinal nodes have resolved. No residual hilar adenopathy is seen.  No internal mammary adenopathy.  Lungs/Pleura: No pleural fluid. Left upper lobe pulmonary nodule measures 3 mm on image 15 versus 7 mm on the prior (when remeasured).  Superior segment left lower lobe nodule measures 5 mm on image 20 versus 9 mm on the prior exam.  Other nodules have undergone similar regression. No enlarging or new nodules identified.  Musculoskeletal: Interval sclerosis at the site of a sternal manubrial osseous lytic lesion on the prior exam.  CT ABDOMEN FINDINGS  Hepatobiliary: Hepatic metastasis. Direct comparison to prior MRI difficult. Metastatic burden overall felt to be decreased. Ill-defined residual hypo attenuating foci are seen throughout the medial segment left in the anterior segment right lobe.  Index medial segment left liver lobe 12 mm lesion on image 47. Right hepatic dome 9 mm lesion on image 44.  Normal gallbladder, without biliary ductal dilatation.  Pancreas: Normal, without mass or ductal dilatation.  Spleen: Normal  Adrenals/Urinary Tract: Normal adrenal glands. Upper pole 3.2 cm right renal cyst. Normal left kidney, without hydronephrosis.  Stomach/Bowel: Normal stomach,  without wall thickening. Normal abdominal large and small bowel loops.  Vascular/Lymphatic: Normal caliber of the aorta and branch vessels. No retroperitoneal or retrocrural adenopathy. No residual porta hepatis adenopathy identified.  Other: No ascites.  No evidence of omental or peritoneal disease.  Musculoskeletal: Interval sclerosis at the site of previously described lytic lesions, including at the L3 and L4 vertebral bodies.  IMPRESSION: 1. Response to therapy of thoracic nodal and pulmonary metastasis. 2. Hepatic metastatic burden is difficult to evaluate secondary to cross modality comparison from prior PET. Overall, felt to be decreased. 3. Sclerosis at the site of previously described lytic osseous metastasis, likely due to healing. 4. No sites of new or progressive disease.   Electronically Signed   By: Abigail Miyamoto M.D.   On: 06/22/2014 09:39    ASSESSMENT and PlAN:  Stage 4  carcinoma of breast.  PET scan has been reviewed shows stable disease markers are slightly high.  Patient had severe   reaction to carboplatinum. \ Will continue E Burien. Upper respiratory infection is resolved  Forest Gleason, MD   07/16/2014 1:35 PM

## 2014-07-21 ENCOUNTER — Ambulatory Visit: Payer: Medicare Other | Admitting: Oncology

## 2014-07-21 ENCOUNTER — Other Ambulatory Visit: Payer: Medicare Other

## 2014-07-21 ENCOUNTER — Ambulatory Visit: Payer: Medicare Other

## 2014-07-22 ENCOUNTER — Inpatient Hospital Stay: Payer: Medicare Other | Attending: Oncology

## 2014-07-22 ENCOUNTER — Encounter (INDEPENDENT_AMBULATORY_CARE_PROVIDER_SITE_OTHER): Payer: Self-pay

## 2014-07-22 DIAGNOSIS — C50912 Malignant neoplasm of unspecified site of left female breast: Secondary | ICD-10-CM | POA: Insufficient documentation

## 2014-07-22 DIAGNOSIS — Z171 Estrogen receptor negative status [ER-]: Secondary | ICD-10-CM | POA: Insufficient documentation

## 2014-07-22 DIAGNOSIS — R5383 Other fatigue: Secondary | ICD-10-CM | POA: Insufficient documentation

## 2014-07-22 DIAGNOSIS — Z800 Family history of malignant neoplasm of digestive organs: Secondary | ICD-10-CM | POA: Insufficient documentation

## 2014-07-22 DIAGNOSIS — I10 Essential (primary) hypertension: Secondary | ICD-10-CM | POA: Diagnosis not present

## 2014-07-22 DIAGNOSIS — C779 Secondary and unspecified malignant neoplasm of lymph node, unspecified: Secondary | ICD-10-CM | POA: Insufficient documentation

## 2014-07-22 DIAGNOSIS — C787 Secondary malignant neoplasm of liver and intrahepatic bile duct: Secondary | ICD-10-CM | POA: Insufficient documentation

## 2014-07-22 DIAGNOSIS — Z5111 Encounter for antineoplastic chemotherapy: Secondary | ICD-10-CM | POA: Diagnosis not present

## 2014-07-22 DIAGNOSIS — Z79899 Other long term (current) drug therapy: Secondary | ICD-10-CM | POA: Diagnosis not present

## 2014-07-22 DIAGNOSIS — C7800 Secondary malignant neoplasm of unspecified lung: Secondary | ICD-10-CM | POA: Insufficient documentation

## 2014-07-22 DIAGNOSIS — M069 Rheumatoid arthritis, unspecified: Secondary | ICD-10-CM | POA: Diagnosis not present

## 2014-07-22 DIAGNOSIS — R531 Weakness: Secondary | ICD-10-CM | POA: Diagnosis not present

## 2014-07-22 LAB — CBC WITH DIFFERENTIAL/PLATELET
BASOS ABS: 0 10*3/uL (ref 0–0.1)
Basophils Relative: 1 %
Eosinophils Absolute: 0 10*3/uL (ref 0–0.7)
Eosinophils Relative: 0 %
HCT: 26.4 % — ABNORMAL LOW (ref 35.0–47.0)
HEMOGLOBIN: 8.7 g/dL — AB (ref 12.0–16.0)
Lymphocytes Relative: 69 %
Lymphs Abs: 1.6 10*3/uL (ref 1.0–3.6)
MCH: 32.9 pg (ref 26.0–34.0)
MCHC: 33.1 g/dL (ref 32.0–36.0)
MCV: 99.4 fL (ref 80.0–100.0)
Monocytes Absolute: 0.3 10*3/uL (ref 0.2–0.9)
Monocytes Relative: 12 %
Neutro Abs: 0.4 10*3/uL — ABNORMAL LOW (ref 1.4–6.5)
Neutrophils Relative %: 18 %
Platelets: 156 10*3/uL (ref 150–440)
RBC: 2.65 MIL/uL — ABNORMAL LOW (ref 3.80–5.20)
RDW: 15.6 % — AB (ref 11.5–14.5)
WBC: 2.3 10*3/uL — ABNORMAL LOW (ref 3.6–11.0)

## 2014-07-27 ENCOUNTER — Other Ambulatory Visit: Payer: Medicare Other

## 2014-07-27 ENCOUNTER — Ambulatory Visit: Payer: Medicare Other | Admitting: Oncology

## 2014-07-29 ENCOUNTER — Ambulatory Visit: Payer: Medicare Other

## 2014-07-29 ENCOUNTER — Inpatient Hospital Stay: Payer: Medicare Other

## 2014-07-29 ENCOUNTER — Inpatient Hospital Stay (HOSPITAL_BASED_OUTPATIENT_CLINIC_OR_DEPARTMENT_OTHER): Payer: Medicare Other | Admitting: Oncology

## 2014-07-29 ENCOUNTER — Other Ambulatory Visit: Payer: Medicare Other

## 2014-07-29 VITALS — BP 125/85 | HR 94 | Temp 98.3°F | Resp 16 | Wt 169.8 lb

## 2014-07-29 DIAGNOSIS — Z5111 Encounter for antineoplastic chemotherapy: Secondary | ICD-10-CM | POA: Diagnosis not present

## 2014-07-29 DIAGNOSIS — C7800 Secondary malignant neoplasm of unspecified lung: Secondary | ICD-10-CM

## 2014-07-29 DIAGNOSIS — C787 Secondary malignant neoplasm of liver and intrahepatic bile duct: Secondary | ICD-10-CM | POA: Diagnosis not present

## 2014-07-29 DIAGNOSIS — R531 Weakness: Secondary | ICD-10-CM

## 2014-07-29 DIAGNOSIS — Z171 Estrogen receptor negative status [ER-]: Secondary | ICD-10-CM

## 2014-07-29 DIAGNOSIS — C50912 Malignant neoplasm of unspecified site of left female breast: Secondary | ICD-10-CM | POA: Diagnosis not present

## 2014-07-29 DIAGNOSIS — Z800 Family history of malignant neoplasm of digestive organs: Secondary | ICD-10-CM

## 2014-07-29 DIAGNOSIS — M069 Rheumatoid arthritis, unspecified: Secondary | ICD-10-CM

## 2014-07-29 DIAGNOSIS — R5383 Other fatigue: Secondary | ICD-10-CM

## 2014-07-29 DIAGNOSIS — Z79899 Other long term (current) drug therapy: Secondary | ICD-10-CM

## 2014-07-29 DIAGNOSIS — I10 Essential (primary) hypertension: Secondary | ICD-10-CM

## 2014-07-29 DIAGNOSIS — C779 Secondary and unspecified malignant neoplasm of lymph node, unspecified: Secondary | ICD-10-CM

## 2014-07-29 LAB — COMPREHENSIVE METABOLIC PANEL
ALK PHOS: 82 U/L (ref 38–126)
ALT: 19 U/L (ref 14–54)
ANION GAP: 4 — AB (ref 5–15)
AST: 36 U/L (ref 15–41)
Albumin: 3.2 g/dL — ABNORMAL LOW (ref 3.5–5.0)
BILIRUBIN TOTAL: 0.4 mg/dL (ref 0.3–1.2)
BUN: 10 mg/dL (ref 6–20)
CHLORIDE: 105 mmol/L (ref 101–111)
CO2: 23 mmol/L (ref 22–32)
Calcium: 8.4 mg/dL — ABNORMAL LOW (ref 8.9–10.3)
Creatinine, Ser: 0.76 mg/dL (ref 0.44–1.00)
GFR calc Af Amer: >60 mL/min (ref >60)
GFR calc non Af Amer: >60 mL/min (ref >60)
GLUCOSE: 134 mg/dL — AB (ref 65–99)
POTASSIUM: 3.4 mmol/L — AB (ref 3.5–5.1)
SODIUM: 132 mmol/L — AB (ref 135–145)
Total Protein: 7.2 g/dL (ref 6.5–8.1)

## 2014-07-29 LAB — CBC WITH DIFFERENTIAL/PLATELET
BASOS ABS: 0 10*3/uL (ref 0–0.1)
Basophils Relative: 0 %
EOS ABS: 0 10*3/uL (ref 0–0.7)
EOS PCT: 0 %
HEMATOCRIT: 30.6 % — AB (ref 35.0–47.0)
Hemoglobin: 9.8 g/dL — ABNORMAL LOW (ref 12.0–16.0)
Lymphocytes Relative: 73 %
Lymphs Abs: 2 10*3/uL (ref 1.0–3.6)
MCH: 32 pg (ref 26.0–34.0)
MCHC: 31.9 g/dL — ABNORMAL LOW (ref 32.0–36.0)
MCV: 100.3 fL — ABNORMAL HIGH (ref 80.0–100.0)
MONO ABS: 0.6 10*3/uL (ref 0.2–0.9)
Monocytes Relative: 23 %
NEUTROS ABS: 0.1 10*3/uL — AB (ref 1.4–6.5)
Neutrophils Relative %: 4 %
PLATELETS: 180 10*3/uL (ref 150–440)
RBC: 3.05 MIL/uL — ABNORMAL LOW (ref 3.80–5.20)
RDW: 16.7 % — ABNORMAL HIGH (ref 11.5–14.5)
WBC: 2.7 10*3/uL — ABNORMAL LOW (ref 3.6–11.0)

## 2014-07-29 LAB — IRON AND TIBC
Iron: 42 ug/dL (ref 28–170)
Saturation Ratios: 13 % (ref 10.4–31.8)
TIBC: 329 ug/dL (ref 250–450)
UIBC: 287 ug/dL

## 2014-07-29 LAB — FERRITIN: Ferritin: 532 ng/mL — ABNORMAL HIGH (ref 11–307)

## 2014-07-29 MED ORDER — HEPARIN SOD (PORK) LOCK FLUSH 100 UNIT/ML IV SOLN
500.0000 [IU] | Freq: Once | INTRAVENOUS | Status: AC | PRN
  Administered 2014-07-29: 500 [IU]

## 2014-07-29 MED ORDER — SODIUM CHLORIDE 0.9 % IJ SOLN
10.0000 mL | INTRAMUSCULAR | Status: DC | PRN
  Administered 2014-07-29: 10 mL
  Filled 2014-07-29: qty 10

## 2014-07-30 LAB — CANCER ANTIGEN 27.29: CA 27.29: 70.8 U/mL — ABNORMAL HIGH (ref 0.0–38.6)

## 2014-07-30 MED ORDER — NYSTATIN 100000 UNIT/ML MT SUSP: 5.0000 mL | Freq: Four times a day (QID) | OROMUCOSAL | Status: DC

## 2014-08-04 ENCOUNTER — Other Ambulatory Visit: Payer: Self-pay | Admitting: Oncology

## 2014-08-05 ENCOUNTER — Encounter: Payer: Self-pay | Admitting: Oncology

## 2014-08-05 ENCOUNTER — Inpatient Hospital Stay: Payer: Medicare Other

## 2014-08-05 ENCOUNTER — Ambulatory Visit: Payer: Medicare Other

## 2014-08-05 ENCOUNTER — Inpatient Hospital Stay (HOSPITAL_BASED_OUTPATIENT_CLINIC_OR_DEPARTMENT_OTHER): Payer: Medicare Other | Admitting: Oncology

## 2014-08-05 VITALS — BP 129/89 | HR 102 | Temp 97.9°F | Resp 20 | Wt 170.6 lb

## 2014-08-05 DIAGNOSIS — Z5111 Encounter for antineoplastic chemotherapy: Secondary | ICD-10-CM | POA: Diagnosis not present

## 2014-08-05 DIAGNOSIS — I10 Essential (primary) hypertension: Secondary | ICD-10-CM

## 2014-08-05 DIAGNOSIS — Z800 Family history of malignant neoplasm of digestive organs: Secondary | ICD-10-CM

## 2014-08-05 DIAGNOSIS — C50912 Malignant neoplasm of unspecified site of left female breast: Secondary | ICD-10-CM | POA: Diagnosis not present

## 2014-08-05 DIAGNOSIS — Z79899 Other long term (current) drug therapy: Secondary | ICD-10-CM

## 2014-08-05 DIAGNOSIS — C7800 Secondary malignant neoplasm of unspecified lung: Secondary | ICD-10-CM | POA: Diagnosis not present

## 2014-08-05 DIAGNOSIS — C779 Secondary and unspecified malignant neoplasm of lymph node, unspecified: Secondary | ICD-10-CM

## 2014-08-05 DIAGNOSIS — C787 Secondary malignant neoplasm of liver and intrahepatic bile duct: Secondary | ICD-10-CM | POA: Diagnosis not present

## 2014-08-05 DIAGNOSIS — Z171 Estrogen receptor negative status [ER-]: Secondary | ICD-10-CM | POA: Diagnosis not present

## 2014-08-05 DIAGNOSIS — M069 Rheumatoid arthritis, unspecified: Secondary | ICD-10-CM

## 2014-08-05 DIAGNOSIS — R5383 Other fatigue: Secondary | ICD-10-CM

## 2014-08-05 DIAGNOSIS — R531 Weakness: Secondary | ICD-10-CM

## 2014-08-05 LAB — CBC WITH DIFFERENTIAL/PLATELET
BASOS PCT: 0 %
Basophils Absolute: 0 10*3/uL (ref 0–0.1)
EOS PCT: 0 %
Eosinophils Absolute: 0 10*3/uL (ref 0–0.7)
HCT: 32.7 % — ABNORMAL LOW (ref 35.0–47.0)
HEMOGLOBIN: 10.8 g/dL — AB (ref 12.0–16.0)
LYMPHS ABS: 2.2 10*3/uL (ref 1.0–3.6)
LYMPHS PCT: 39 %
MCH: 32.2 pg (ref 26.0–34.0)
MCHC: 32.9 g/dL (ref 32.0–36.0)
MCV: 98 fL (ref 80.0–100.0)
Monocytes Absolute: 0.6 10*3/uL (ref 0.2–0.9)
Monocytes Relative: 12 %
Neutro Abs: 2.8 10*3/uL (ref 1.4–6.5)
Neutrophils Relative %: 49 %
Platelets: 169 10*3/uL (ref 150–440)
RBC: 3.34 MIL/uL — AB (ref 3.80–5.20)
RDW: 16 % — AB (ref 11.5–14.5)
WBC: 5.6 10*3/uL (ref 3.6–11.0)

## 2014-08-05 LAB — COMPREHENSIVE METABOLIC PANEL
ALT: 14 U/L (ref 14–54)
AST: 30 U/L (ref 15–41)
Albumin: 3.6 g/dL (ref 3.5–5.0)
Alkaline Phosphatase: 83 U/L (ref 38–126)
Anion gap: 5 (ref 5–15)
BUN: 15 mg/dL (ref 6–20)
CALCIUM: 8.5 mg/dL — AB (ref 8.9–10.3)
CO2: 24 mmol/L (ref 22–32)
Chloride: 101 mmol/L (ref 101–111)
Creatinine, Ser: 0.75 mg/dL (ref 0.44–1.00)
GFR calc non Af Amer: >60 mL/min (ref >60)
Glucose, Bld: 157 mg/dL — ABNORMAL HIGH (ref 65–99)
Potassium: 3.6 mmol/L (ref 3.5–5.1)
SODIUM: 130 mmol/L — AB (ref 135–145)
TOTAL PROTEIN: 7.8 g/dL (ref 6.5–8.1)
Total Bilirubin: 0.5 mg/dL (ref 0.3–1.2)

## 2014-08-05 MED ORDER — SODIUM CHLORIDE 0.9 % IV SOLN
Freq: Once | INTRAVENOUS | Status: AC
  Administered 2014-08-05: 15:00:00 via INTRAVENOUS
  Filled 2014-08-05: qty 4

## 2014-08-05 MED ORDER — SODIUM CHLORIDE 0.9 % IV SOLN
Freq: Once | INTRAVENOUS | Status: AC
  Administered 2014-08-05: 15:00:00 via INTRAVENOUS
  Filled 2014-08-05: qty 1000

## 2014-08-05 MED ORDER — SODIUM CHLORIDE 0.9 % IV SOLN: 1.1000 mg/m2 | Freq: Once | INTRAVENOUS | Status: DC

## 2014-08-05 MED ORDER — ERIBULIN MESYLATE CHEMO INJECTION 1 MG/2ML
2.0000 mg | Freq: Once | INTRAVENOUS | Status: AC
  Administered 2014-08-05: 2 mg via INTRAVENOUS
  Filled 2014-08-05: qty 4

## 2014-08-05 MED ORDER — HEPARIN SOD (PORK) LOCK FLUSH 100 UNIT/ML IV SOLN
500.0000 [IU] | Freq: Once | INTRAVENOUS | Status: AC | PRN
  Administered 2014-08-05: 500 [IU]
  Filled 2014-08-05: qty 5

## 2014-08-05 NOTE — Progress Notes (Signed)
[place] @ [facility] Telephone:(336) [phone]  Fax:(336) [phone]     Melis Trochez OB: March 27, 1960  MR#: [id number]  OTL#:[id number]  Patient Care Team: Jodi Marble, MD as PCP - General (Internal Medicine) Forest Gleason, MD (Unknown Physician Specialty) Christene Lye, MD (General Surgery) Julieanne Manson Leeanne Mannan., MD as Consulting Physician (Rheumatology)  CHIEF COMPLAINT:  Chief Complaint  Patient presents with  . Follow-up    Carcinoma of breast     Oncology History   1. Carcinoma of breast (left) diagnosis on June 14, by a core needle biopsy and lymph node biopsy. Estrogen receptor negative, progesterone receptor negative, HER-2 receptor negative. 2. Status post lumpectomy (January, 2012), 0.9 cm tumor, one positive lymph node.  Margins may be involved. AJCC Staging: pT1b_N1_M_0 Stage Grouping: status postchemotherapy and radiation treatment. 3.  MyRisk genetic mutation study is negative for any mutation. 4.  Patient also has had due to Rheumatoid arthritis on methotrexate tablet. 5.  PET scan shows progressive disease with multiple liver metastases and lung metastases (November, 18th, 2015)  6.  Patient was started on chemotherapy with carboplatinum /abraxene  day 1 day 8 schedule biopsy from jugular lymph node  was positive for metastatic breast cancer.         Cancer of left breast, stage 4   06/25/2014 Initial Diagnosis Cancer of left breast, stage 4    Oncology Flowsheet 06/30/2014 07/15/2014  Day, Cycle Day 1, Cycle 1 Day 8, Cycle 1  dexamethasone (DECADRON) IV [ 10 mg ] [ 10 mg ]  eriBULin mesylate (HALAVEN) IV 1.4 mg/m2 1.1 mg/m2  ondansetron (ZOFRAN) IV [ 8 mg ] [ 8 mg ]    INTERVAL HISTORY  Patient returns to clinic today for further evaluation and consideration of her next infusion of chemotherapy. She continues to have increased weakness and fatigue, but otherwise feels well. She denies any chest pain or shortness of breath. She has a fair appetite.  Patient offers no further specific complaint.   REVIEW OF SYSTEMS:   Review of Systems  Constitutional: Positive for malaise/fatigue.  Respiratory: Negative.   Cardiovascular: Negative.   Gastrointestinal: Positive for heartburn.  Neurological: Positive for weakness.    As per HPI. Otherwise, a complete review of systems is negatve.  PAST MEDICAL HISTORY: Past Medical History  Diagnosis Date  . Rheumatoid arthritis   . Cancer 2012    breast   . Breast cancer   . Hypertension   . Cancer of left breast, stage 4 06/25/2014    PAST SURGICAL HISTORY: Past Surgical History  Procedure Laterality Date  . Breast surgery  2010    lumpectomy  . Tubal ligation  1993  . Carpal tunnel release Bilateral   . Portacath placement  01/19/14    FAMILY HISTORY Family History  Problem Relation Age of Onset  . Cancer Father     colon        ADVANCED DIRECTIVES:  patient was advised and discuss her living will.  All available resources were offered   HEALTH MAINTENANCE: History  Substance Use Topics  . Smoking status: Never Smoker   . Smokeless tobacco: Never Used  . Alcohol Use: No      Allergies  Allergen Reactions  . Carboplatin Shortness Of Breath    Other reaction(s): Tight chest (finding)    Current Outpatient Prescriptions  Medication Sig Dispense Refill  . Ferrous Sulfate (SLOW FE) 142 (45 FE) MG TBCR Take 1 tablet by mouth daily.    . fluticasone (  FLONASE) 50 MCG/ACT nasal spray 1 spray as needed for allergies.   0  . folic acid (FOLVITE) 1 MG tablet Take 1 mg by mouth daily.    . hydrochlorothiazide (MICROZIDE) 12.5 MG capsule Take 12.5 mg by mouth daily.    . hydroxychloroquine (PLAQUENIL) 200 MG tablet Take 200 mg by mouth daily.    Marland Kitchen ibuprofen (ADVIL,MOTRIN) 800 MG tablet Take 800 mg by mouth every 8 (eight) hours as needed.    . lidocaine-prilocaine (EMLA) cream Apply 1 application topically as needed. 30 min prior to accessing port 30 g 3  . loratadine  (CLARITIN) 10 MG tablet Take 10 mg by mouth daily.    . magnesium chloride (SLOW-MAG) 64 MG TBEC SR tablet Take 1 tablet (64 mg total) by mouth 2 (two) times daily. 60 tablet 3  . pantoprazole (PROTONIX) 40 MG tablet Take 40 mg by mouth daily.    . promethazine (PHENERGAN) 25 MG tablet Take 25 mg by mouth every 6 (six) hours as needed for nausea or vomiting.    . traMADol (ULTRAM) 50 MG tablet Take 50 mg by mouth every 6 (six) hours as needed for moderate pain.    Marland Kitchen nystatin (MYCOSTATIN) 100000 UNIT/ML suspension Take 5 mLs (500,000 Units total) by mouth 4 (four) times daily. 120 mL 0   No current facility-administered medications for this visit.    OBJECTIVE: Filed Vitals:   07/29/14 1637  BP: 125/85  Pulse: 94  Temp: 98.3 F (36.8 C)  Resp: 16     Body mass index is 33.15 kg/(m^2).    ECOG FS:1 - Symptomatic but completely ambulatory  Physical Exam general status: No acute distress. Lungs: Air  entry equal on both sides.  No rhonchi.  No rales.   Cardiac: Heart sounds are normal.  No pericardial rub.  No murmur. GI: Abdomen is soft.  No ascites.  Liver spleen not palpable.  No tenderness.  Bowel sounds are within normal limit Lower extremity: No edema Neurological system: Higher functions, cranial nerves intact no evidence of peripheral neuropathy. Skin: No rash.  No ecchymosis.Marland Kitchen  LAB RESULTS:     Component Value Date/Time   NA 132* 07/29/2014 1352   NA 131* 06/03/2014 0947   K 3.4* 07/29/2014 1352   K 3.5 06/03/2014 0947   CL 105 07/29/2014 1352   CL 102 06/03/2014 0947   CO2 23 07/29/2014 1352   CO2 23 06/03/2014 0947   GLUCOSE 134* 07/29/2014 1352   GLUCOSE 131* 06/03/2014 0947   BUN 10 07/29/2014 1352   BUN 12 06/03/2014 0947   CREATININE 0.76 07/29/2014 1352   CREATININE 0.88 06/03/2014 0947   CALCIUM 8.4* 07/29/2014 1352   CALCIUM 8.8* 06/03/2014 0947   PROT 7.2 07/29/2014 1352   PROT 7.8 06/03/2014 0947   ALBUMIN 3.2* 07/29/2014 1352   ALBUMIN 3.3*  06/03/2014 0947   AST 36 07/29/2014 1352   AST 74* 06/03/2014 0947   ALT 19 07/29/2014 1352   ALT 47 06/03/2014 0947   ALKPHOS 82 07/29/2014 1352   ALKPHOS 146* 06/03/2014 0947   BILITOT 0.4 07/29/2014 1352   GFRNONAA >60 07/29/2014 1352   GFRNONAA >60 06/03/2014 0947   GFRAA >60 07/29/2014 1352   GFRAA >60 06/03/2014 0947    No results found for: SPEP, UPEP  Lab Results  Component Value Date   WBC 5.6 08/05/2014   NEUTROABS 2.8 08/05/2014   HGB 10.8* 08/05/2014   HCT 32.7* 08/05/2014   MCV 98.0 08/05/2014   PLT  169 08/05/2014      Chemistry      Component Value Date/Time   NA 132* 07/29/2014 1352   NA 131* 06/03/2014 0947   K 3.4* 07/29/2014 1352   K 3.5 06/03/2014 0947   CL 105 07/29/2014 1352   CL 102 06/03/2014 0947   CO2 23 07/29/2014 1352   CO2 23 06/03/2014 0947   BUN 10 07/29/2014 1352   BUN 12 06/03/2014 0947   CREATININE 0.76 07/29/2014 1352   CREATININE 0.88 06/03/2014 0947      Component Value Date/Time   CALCIUM 8.4* 07/29/2014 1352   CALCIUM 8.8* 06/03/2014 0947   ALKPHOS 82 07/29/2014 1352   ALKPHOS 146* 06/03/2014 0947   AST 36 07/29/2014 1352   AST 74* 06/03/2014 0947   ALT 19 07/29/2014 1352   ALT 47 06/03/2014 0947   BILITOT 0.4 07/29/2014 1352       Lab Results  Component Value Date   LABCA2 70.8* 07/29/2014    No components found for: [id number]  No results for input(s): INR in the last 168 hours.  No results found for: COLORURINE, APPEARANCEUR, LABSPEC, PHURINE, GLUCOSEU, HGBUR, BILIRUBINUR, KETONESUR, PROTEINUR, UROBILINOGEN, NITRITE, LEUKOCYTESUR  STUDIES: No results found.  ASSESSMENT and PlAN:  1. Breast cancer: Previously PET scan was reviewed and revealed stable disease. CA-27-29 is trending down at 70.8. Proceed with halaven today. Return to clinic in 1 week for further evaluation, repeat laboratory work and consideration of her next treatment. Patient has understanding and was in agreement with this plan.     Lloyd Huger, MD   08/05/2014 2:24 PM

## 2014-08-06 NOTE — Progress Notes (Signed)
[place] @ [facility] Telephone:(336) [phone]  Fax:(336) [phone]     Jissel Slavens OB: 10/27/1960  MR#: [id number]  LXB#:[id number]  Patient Care Team: Jodi Marble, MD as PCP - General (Internal Medicine) Forest Gleason, MD (Unknown Physician Specialty) Christene Lye, MD (General Surgery) Julieanne Manson Leeanne Mannan., MD as Consulting Physician (Rheumatology)  CHIEF COMPLAINT:  Chief Complaint  Patient presents with  . Follow-up    Breast Cancer    Oncology History   1. Carcinoma of breast (left) diagnosis on June 14, by a core needle biopsy and lymph node biopsy. Estrogen receptor negative, progesterone receptor negative, HER-2 receptor negative. 2. Status post lumpectomy (January, 2012), 0.9 cm tumor, one positive lymph node.  Margins may be involved. AJCC Staging: [id number]_M_0 Stage Grouping: status postchemotherapy and radiation treatment. 3.  MyRisk genetic mutation study is negative for any mutation. 4.  Patient also has had due to Rheumatoid arthritis on methotrexate tablet. 5.  PET scan shows progressive disease with multiple liver metastases and lung metastases (November, 18th, 2015)  6.  Patient was started on chemotherapy with carboplatinum /abraxene  day 1 day 8 schedule biopsy from jugular lymph node  was positive for metastatic breast cancer.         Cancer of left breast, stage 4   06/25/2014 Initial Diagnosis Cancer of left breast, stage 4    Oncology Flowsheet 06/30/2014 07/15/2014 08/05/2014  Day, Cycle Day 1, Cycle 1 Day 8, Cycle 1 Day 8, Cycle 2  dexamethasone (DECADRON) IV [ 10 mg ] [ 10 mg ] [ 10 mg ]  eriBULin mesylate (HALAVEN) IV 1.4 mg/m2 1.1 mg/m2 2 mg  ondansetron (ZOFRAN) IV [ 8 mg ] [ 8 mg ] [ 8 mg ]    INTERVAL HISTORY  Patient returns to clinic today for further evaluation and consideration of her next infusion of chemotherapy. She currently feels well and is asymptomatic. She does not complain of weakness or fatigue today. As any fevers.  She has no neurologic complaints. She denies any chest pain or shortness of breath. She has a fair appetite has any nausea, vomiting, constipation, or diarrhea. Patient offers no specific complaints today.   REVIEW OF SYSTEMS:   Review of Systems  Constitutional: Negative for malaise/fatigue.  Respiratory: Negative.   Cardiovascular: Negative.   Gastrointestinal: Positive for heartburn.  Neurological: Negative for weakness.    As per HPI. Otherwise, a complete review of systems is negatve.  PAST MEDICAL HISTORY: Past Medical History  Diagnosis Date  . Rheumatoid arthritis   . Cancer 2012    breast   . Breast cancer   . Hypertension   . Cancer of left breast, stage 4 06/25/2014    PAST SURGICAL HISTORY: Past Surgical History  Procedure Laterality Date  . Breast surgery  2010    lumpectomy  . Tubal ligation  1993  . Carpal tunnel release Bilateral   . Portacath placement  01/19/14    FAMILY HISTORY Family History  Problem Relation Age of Onset  . Cancer Father     colon        ADVANCED DIRECTIVES:  patient was advised and discuss her living will.  All available resources were offered   HEALTH MAINTENANCE: History  Substance Use Topics  . Smoking status: Never Smoker   . Smokeless tobacco: Never Used  . Alcohol Use: No      Allergies  Allergen Reactions  . Carboplatin Shortness Of Breath    Other reaction(s): Tight chest (finding)  Current Outpatient Prescriptions  Medication Sig Dispense Refill  . Ferrous Sulfate (SLOW FE) 142 (45 FE) MG TBCR Take 1 tablet by mouth daily.    . fluticasone (FLONASE) 50 MCG/ACT nasal spray 1 spray as needed for allergies.   0  . folic acid (FOLVITE) 1 MG tablet Take 1 mg by mouth daily.    . hydrochlorothiazide (MICROZIDE) 12.5 MG capsule Take 12.5 mg by mouth daily.    . hydroxychloroquine (PLAQUENIL) 200 MG tablet Take 200 mg by mouth daily.    Marland Kitchen ibuprofen (ADVIL,MOTRIN) 800 MG tablet Take 800 mg by mouth every 8  (eight) hours as needed.    . lidocaine-prilocaine (EMLA) cream Apply 1 application topically as needed. 30 min prior to accessing port 30 g 3  . loratadine (CLARITIN) 10 MG tablet Take 10 mg by mouth daily.    . magnesium chloride (SLOW-MAG) 64 MG TBEC SR tablet Take 1 tablet (64 mg total) by mouth 2 (two) times daily. 60 tablet 3  . nystatin (MYCOSTATIN) 100000 UNIT/ML suspension Take 5 mLs (500,000 Units total) by mouth 4 (four) times daily. 120 mL 0  . pantoprazole (PROTONIX) 40 MG tablet Take 40 mg by mouth daily.    . promethazine (PHENERGAN) 25 MG tablet Take 25 mg by mouth every 6 (six) hours as needed for nausea or vomiting.    . traMADol (ULTRAM) 50 MG tablet Take 50 mg by mouth every 6 (six) hours as needed for moderate pain.     No current facility-administered medications for this visit.    OBJECTIVE: Filed Vitals:   08/05/14 1437  BP: 129/89  Pulse: 102  Temp: 97.9 F (36.6 C)  Resp: 20     Body mass index is 33.33 kg/(m^2).    ECOG FS:0 - Asymptomatic  Physical Exam general status: No acute distress. Lungs: Air  entry equal on both sides.  No rhonchi.  No rales.   Cardiac: Heart sounds are normal.  No pericardial rub.  No murmur. GI: Abdomen is soft.  No ascites.  Liver spleen not palpable.  No tenderness.  Bowel sounds are within normal limit Lower extremity: No edema Neurological system: Higher functions, cranial nerves intact no evidence of peripheral neuropathy. Skin: No rash.  No ecchymosis.Marland Kitchen  LAB RESULTS:     Component Value Date/Time   NA 130* 08/05/2014 1406   NA 131* 06/03/2014 0947   K 3.6 08/05/2014 1406   K 3.5 06/03/2014 0947   CL 101 08/05/2014 1406   CL 102 06/03/2014 0947   CO2 24 08/05/2014 1406   CO2 23 06/03/2014 0947   GLUCOSE 157* 08/05/2014 1406   GLUCOSE 131* 06/03/2014 0947   BUN 15 08/05/2014 1406   BUN 12 06/03/2014 0947   CREATININE 0.75 08/05/2014 1406   CREATININE 0.88 06/03/2014 0947   CALCIUM 8.5* 08/05/2014 1406    CALCIUM 8.8* 06/03/2014 0947   PROT 7.8 08/05/2014 1406   PROT 7.8 06/03/2014 0947   ALBUMIN 3.6 08/05/2014 1406   ALBUMIN 3.3* 06/03/2014 0947   AST 30 08/05/2014 1406   AST 74* 06/03/2014 0947   ALT 14 08/05/2014 1406   ALT 47 06/03/2014 0947   ALKPHOS 83 08/05/2014 1406   ALKPHOS 146* 06/03/2014 0947   BILITOT 0.5 08/05/2014 1406   GFRNONAA >60 08/05/2014 1406   GFRNONAA >60 06/03/2014 0947   GFRAA >60 08/05/2014 1406   GFRAA >60 06/03/2014 0947    No results found for: SPEP, UPEP  Lab Results  Component Value Date  WBC 5.6 08/05/2014   NEUTROABS 2.8 08/05/2014   HGB 10.8* 08/05/2014   HCT 32.7* 08/05/2014   MCV 98.0 08/05/2014   PLT 169 08/05/2014      Chemistry      Component Value Date/Time   NA 130* 08/05/2014 1406   NA 131* 06/03/2014 0947   K 3.6 08/05/2014 1406   K 3.5 06/03/2014 0947   CL 101 08/05/2014 1406   CL 102 06/03/2014 0947   CO2 24 08/05/2014 1406   CO2 23 06/03/2014 0947   BUN 15 08/05/2014 1406   BUN 12 06/03/2014 0947   CREATININE 0.75 08/05/2014 1406   CREATININE 0.88 06/03/2014 0947      Component Value Date/Time   CALCIUM 8.5* 08/05/2014 1406   CALCIUM 8.8* 06/03/2014 0947   ALKPHOS 83 08/05/2014 1406   ALKPHOS 146* 06/03/2014 0947   AST 30 08/05/2014 1406   AST 74* 06/03/2014 0947   ALT 14 08/05/2014 1406   ALT 47 06/03/2014 0947   BILITOT 0.5 08/05/2014 1406       Lab Results  Component Value Date   LABCA2 70.8* 07/29/2014    No components found for: [id number]  No results for input(s): INR in the last 168 hours.  No results found for: COLORURINE, APPEARANCEUR, LABSPEC, PHURINE, GLUCOSEU, HGBUR, BILIRUBINUR, KETONESUR, PROTEINUR, UROBILINOGEN, NITRITE, LEUKOCYTESUR  STUDIES: No results found.  ASSESSMENT and PlAN:  1. Breast cancer: Previously PET scan was reviewed and revealed stable disease. CA-27-29 is trending down at 70.8. Proceed with halaven today. Return to clinic in 2 weeks for further evaluation, repeat  laboratory work and consideration of her next treatment. Patient has understanding and was in agreement with this plan.    Lloyd Huger, MD   08/06/2014 5:35 PM

## 2014-08-19 ENCOUNTER — Inpatient Hospital Stay: Payer: Medicare Other

## 2014-08-19 ENCOUNTER — Encounter: Payer: Self-pay | Admitting: Oncology

## 2014-08-19 ENCOUNTER — Inpatient Hospital Stay (HOSPITAL_BASED_OUTPATIENT_CLINIC_OR_DEPARTMENT_OTHER): Payer: Medicare Other | Admitting: Oncology

## 2014-08-19 ENCOUNTER — Other Ambulatory Visit: Payer: Self-pay | Admitting: *Deleted

## 2014-08-19 VITALS — BP 122/85 | HR 88 | Temp 96.7°F | Wt 168.7 lb

## 2014-08-19 DIAGNOSIS — C50912 Malignant neoplasm of unspecified site of left female breast: Secondary | ICD-10-CM

## 2014-08-19 DIAGNOSIS — Z171 Estrogen receptor negative status [ER-]: Secondary | ICD-10-CM

## 2014-08-19 DIAGNOSIS — Z79899 Other long term (current) drug therapy: Secondary | ICD-10-CM

## 2014-08-19 DIAGNOSIS — C7800 Secondary malignant neoplasm of unspecified lung: Secondary | ICD-10-CM | POA: Diagnosis not present

## 2014-08-19 DIAGNOSIS — C787 Secondary malignant neoplasm of liver and intrahepatic bile duct: Secondary | ICD-10-CM | POA: Diagnosis not present

## 2014-08-19 DIAGNOSIS — M069 Rheumatoid arthritis, unspecified: Secondary | ICD-10-CM

## 2014-08-19 DIAGNOSIS — I10 Essential (primary) hypertension: Secondary | ICD-10-CM

## 2014-08-19 DIAGNOSIS — Z5111 Encounter for antineoplastic chemotherapy: Secondary | ICD-10-CM | POA: Diagnosis not present

## 2014-08-19 DIAGNOSIS — R5383 Other fatigue: Secondary | ICD-10-CM

## 2014-08-19 DIAGNOSIS — Z800 Family history of malignant neoplasm of digestive organs: Secondary | ICD-10-CM

## 2014-08-19 DIAGNOSIS — C779 Secondary and unspecified malignant neoplasm of lymph node, unspecified: Secondary | ICD-10-CM

## 2014-08-19 DIAGNOSIS — R531 Weakness: Secondary | ICD-10-CM

## 2014-08-19 LAB — COMPREHENSIVE METABOLIC PANEL
ALT: 15 U/L (ref 14–54)
ANION GAP: 5 (ref 5–15)
AST: 27 U/L (ref 15–41)
Albumin: 3.3 g/dL — ABNORMAL LOW (ref 3.5–5.0)
Alkaline Phosphatase: 82 U/L (ref 38–126)
BUN: 12 mg/dL (ref 6–20)
CO2: 23 mmol/L (ref 22–32)
Calcium: 8.6 mg/dL — ABNORMAL LOW (ref 8.9–10.3)
Chloride: 105 mmol/L (ref 101–111)
Creatinine, Ser: 0.8 mg/dL (ref 0.44–1.00)
Glucose, Bld: 103 mg/dL — ABNORMAL HIGH (ref 65–99)
Potassium: 3.9 mmol/L (ref 3.5–5.1)
SODIUM: 133 mmol/L — AB (ref 135–145)
Total Bilirubin: 0.4 mg/dL (ref 0.3–1.2)
Total Protein: 7.3 g/dL (ref 6.5–8.1)

## 2014-08-19 LAB — CBC WITH DIFFERENTIAL/PLATELET
BASOS PCT: 1 %
Basophils Absolute: 0 10*3/uL (ref 0–0.1)
EOS PCT: 0 %
Eosinophils Absolute: 0 10*3/uL (ref 0–0.7)
HCT: 30.7 % — ABNORMAL LOW (ref 35.0–47.0)
Hemoglobin: 10 g/dL — ABNORMAL LOW (ref 12.0–16.0)
LYMPHS ABS: 2 10*3/uL (ref 1.0–3.6)
LYMPHS PCT: 50 %
MCH: 31.1 pg (ref 26.0–34.0)
MCHC: 32.5 g/dL (ref 32.0–36.0)
MCV: 95.6 fL (ref 80.0–100.0)
MONO ABS: 0.7 10*3/uL (ref 0.2–0.9)
Monocytes Relative: 17 %
Neutro Abs: 1.2 10*3/uL — ABNORMAL LOW (ref 1.4–6.5)
Neutrophils Relative %: 32 %
PLATELETS: 186 10*3/uL (ref 150–440)
RBC: 3.22 MIL/uL — ABNORMAL LOW (ref 3.80–5.20)
RDW: 15.7 % — ABNORMAL HIGH (ref 11.5–14.5)
WBC: 4 10*3/uL (ref 3.6–11.0)

## 2014-08-19 MED ORDER — HEPARIN SOD (PORK) LOCK FLUSH 100 UNIT/ML IV SOLN
500.0000 [IU] | Freq: Once | INTRAVENOUS | Status: AC | PRN
  Administered 2014-08-19: 500 [IU]

## 2014-08-19 MED ORDER — ERIBULIN MESYLATE CHEMO INJECTION 1 MG/2ML
1.1000 mg/m2 | Freq: Once | INTRAVENOUS | Status: AC
  Administered 2014-08-19: 2 mg via INTRAVENOUS
  Filled 2014-08-19: qty 4

## 2014-08-19 MED ORDER — SODIUM CHLORIDE 0.9 % IJ SOLN
10.0000 mL | INTRAMUSCULAR | Status: AC | PRN
Start: 2014-08-19 — End: ?
  Filled 2014-08-19: qty 10

## 2014-08-19 MED ORDER — SODIUM CHLORIDE 0.9 % IV SOLN
Freq: Once | INTRAVENOUS | Status: AC
  Filled 2014-08-19: qty 4

## 2014-08-19 MED ORDER — ERIBULIN MESYLATE CHEMO INJECTION 1 MG/2ML: 1.1000 mg/m2 | Freq: Once | INTRAVENOUS | Status: DC

## 2014-08-19 MED ORDER — SODIUM CHLORIDE 0.9 % IV SOLN
Freq: Once | INTRAVENOUS | Status: AC
  Administered 2014-08-19: 16:00:00 via INTRAVENOUS
  Filled 2014-08-19: qty 1000

## 2014-08-19 NOTE — Progress Notes (Signed)
[place] @ [facility] [phone]) [phone]  [phone]) [phone]     Bernedette Auston OB: 1960-03-14  MR#: [id number]  LFY#:[id number]  Patient Care Team: Jodi Marble, MD as PCP - General (Internal Medicine) Forest Gleason, MD (Unknown Physician Specialty) Christene Lye, MD (General Surgery) Julieanne Manson Leeanne Mannan., MD as Consulting Physician (Rheumatology)  CHIEF COMPLAINT:  Chief Complaint  Patient presents with  . Follow-up    Oncology History   1. Carcinoma of breast (left) diagnosis on June 14, by a core needle biopsy and lymph node biopsy. Estrogen receptor negative, progesterone receptor negative, HER-2 receptor negative. 2. Status post lumpectomy (January, 2012), 0.9 cm tumor, one positive lymph node.  Margins may be involved. AJCC Staging: [id number]_N1_M_0 Stage Grouping: status postchemotherapy and radiation treatment. 3.  MyRisk genetic mutation study is negative for any mutation. 4.  Patient also has had due to Rheumatoid arthritis on methotrexate tablet. 5.  PET scan shows progressive disease with multiple liver metastases and lung metastases (November, 18th, 2015)  6.  Patient was started on chemotherapy with carboplatinum /abraxene  day 1 day 8 schedule biopsy from jugular lymph node  was positive for metastatic breast cancer.         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 Patient could not get day 8 chemotherapy because of low blood count as well as upper respiratory tract  infection.  Patient had some ulcers in the mouth.  But gradually improving.  No soreness in the mouth.  General condition is improving.  Year for further evaluation and continuation of chemotherapy . Patient is here for ongoing evaluation and continuation of revealing therapy  No chills.  No fever.   Anemia persist.  Weakness persists.  Left breast is irregular mass  No tingling numbness.   REVIEW OF SYSTEMS:   Review of Systems  Constitutional: Positive for malaise/fatigue. Negative for fever, chills, weight loss and diaphoresis.  HENT: Negative for congestion, ear discharge, ear pain, hearing loss, nosebleeds, sore throat and tinnitus.   Eyes: Negative for blurred vision, double vision, photophobia, pain, discharge and redness.  Respiratory: Negative for cough, hemoptysis, sputum production, shortness of breath, wheezing and stridor.   Cardiovascular: Positive for leg swelling. Negative for chest pain, palpitations, orthopnea, claudication and PND.  Gastrointestinal: Positive for heartburn. Negative for nausea, vomiting, abdominal pain, diarrhea, constipation, blood in stool and melena.  Genitourinary: Negative for dysuria, urgency, frequency, hematuria and flank pain.  Musculoskeletal: Negative for myalgias, back pain, joint pain, falls and neck pain.  Skin: Negative for itching and rash.  Neurological: Negative for dizziness, tingling, tremors, sensory change, speech change, focal weakness, seizures, loss of consciousness, weakness and headaches.  Endo/Heme/Allergies: Negative for environmental allergies and polydipsia. Does not bruise/bleed easily.  Psychiatric/Behavioral: Positive for depression. Negative for suicidal ideas, hallucinations, memory loss and substance abuse. The patient is not nervous/anxious and does not have insomnia.  As per HPI. Otherwise, a complete review of systems is negatve.  PAST MEDICAL HISTORY: Past Medical History  Diagnosis Date  . Rheumatoid arthritis    . Cancer 2012    breast   . Breast cancer   . Hypertension   . Cancer of left breast, stage 4 06/25/2014    PAST SURGICAL HISTORY: Past Surgical History  Procedure Laterality Date  . Breast surgery  2010    lumpectomy  . Tubal ligation  1993  . Carpal tunnel release Bilateral   . Portacath placement  01/19/14    FAMILY HISTORY Family History  Problem Relation Age of Onset  . Cancer Father     colon        ADVANCED DIRECTIVES:  patient was advised and discuss her living will.  All available resources were offered   HEALTH MAINTENANCE: History  Substance Use Topics  . Smoking status: Never Smoker   . Smokeless tobacco: Never Used  . Alcohol Use: No      Allergies  Allergen Reactions  . Carboplatin Shortness Of Breath    Other reaction(s): Tight chest (finding)    Current Outpatient Prescriptions  Medication Sig Dispense Refill  . Ferrous Sulfate (SLOW FE) 142 (45 FE) MG TBCR Take 1 tablet by mouth daily.    . fluticasone (FLONASE) 50 MCG/ACT nasal spray 1 spray as needed for allergies.   0  . folic acid (FOLVITE) 1 MG tablet Take 1 mg by mouth daily.    . hydrochlorothiazide (MICROZIDE) 12.5 MG capsule Take 12.5 mg by mouth daily.    . hydroxychloroquine (PLAQUENIL) 200 MG tablet Take 200 mg by mouth daily.    Marland Kitchen ibuprofen (ADVIL,MOTRIN) 800 MG tablet Take 800 mg by mouth every 8 (eight) hours as needed.    . lidocaine-prilocaine (EMLA) cream Apply 1 application topically as needed. 30 min prior to accessing port 30 g 3  . loratadine (CLARITIN) 10 MG tablet Take 10 mg by mouth daily.    . magnesium chloride (SLOW-MAG) 64 MG TBEC SR tablet Take 1 tablet (64 mg total) by mouth 2 (two) times daily. 60 tablet 3  . nystatin (MYCOSTATIN) 100000 UNIT/ML suspension Take 5 mLs (500,000 Units total) by mouth 4 (four) times daily. 120 mL 0  . pantoprazole (PROTONIX) 40 MG tablet Take 40 mg by mouth daily.    . promethazine (PHENERGAN) 25 MG tablet Take 25 mg by mouth every  6 (six) hours as needed for nausea or vomiting.    . traMADol (ULTRAM) 50 MG tablet Take 50 mg by mouth every 6 (six) hours as needed for moderate pain.     No current facility-administered medications for this visit.   Facility-Administered Medications Ordered in Other Visits  Medication Dose Route Frequency Provider Last Rate Last Dose  . ondansetron (ZOFRAN) 8 mg, dexamethasone (DECADRON) 10 mg in sodium chloride 0.9 % 50 mL IVPB   Intravenous Once Forest Gleason, MD      . sodium chloride 0.9 % injection 10 mL  10 mL Intracatheter PRN Forest Gleason, MD        OBJECTIVE: Filed Vitals:   08/19/14 1433  BP: 122/85  Pulse: 88  Temp: 96.7 F (35.9 C)     Body mass index is 32.94 kg/(m^2).    ECOG FS:1 - Symptomatic but completely ambulatory  Physical Exam general status: Performance status is good.  Patient has not lost significant weight HEENT: No evidence of stomatitis. Sclera and conjunctivae :: No jaundice.   pale looking.alopecia  Lungs: Air  entry equal on both sides.  No rhonchi.  No rales.  Cardiac: Heart sounds are normal.  No pericardial rub.  No murmur. Lymphatic system: Cervical, axillary, inguinal, lymph nodes not palpable GI: Abdomen is soft.  No ascites.  Liver spleen not palpable.  No tenderness.  Bowel sounds are within normal limit Lower extremity: No edema Neurological system: Higher functions, cranial nerves intact no evidence of peripheral neuropathy. Skin: No rash.  No ecchymosis.. Examination of left breast shows nodular growth.  Right breast free of masses  LAB RESULTS:     Component Value Date/Time   NA 133* 08/19/2014 1400   NA 131* 06/03/2014 0947   K 3.9 08/19/2014 1400   K 3.5 06/03/2014 0947   CL 105 08/19/2014 1400   CL 102 06/03/2014 0947   CO2 23 08/19/2014 1400   CO2 23 06/03/2014 0947   GLUCOSE 103* 08/19/2014 1400   GLUCOSE 131* 06/03/2014 0947   BUN 12 08/19/2014 1400   BUN 12 06/03/2014 0947   CREATININE 0.80 08/19/2014 1400    CREATININE 0.88 06/03/2014 0947   CALCIUM 8.6* 08/19/2014 1400   CALCIUM 8.8* 06/03/2014 0947   PROT 7.3 08/19/2014 1400   PROT 7.8 06/03/2014 0947   ALBUMIN 3.3* 08/19/2014 1400   ALBUMIN 3.3* 06/03/2014 0947   AST 27 08/19/2014 1400   AST 74* 06/03/2014 0947   ALT 15 08/19/2014 1400   ALT 47 06/03/2014 0947   ALKPHOS 82 08/19/2014 1400   ALKPHOS 146* 06/03/2014 0947   BILITOT 0.4 08/19/2014 1400   GFRNONAA >60 08/19/2014 1400   GFRNONAA >60 06/03/2014 0947   GFRAA >60 08/19/2014 1400   GFRAA >60 06/03/2014 0947    No results found for: SPEP, UPEP  Lab Results  Component Value Date   WBC 4.0 08/19/2014   NEUTROABS 1.2* 08/19/2014   HGB 10.0* 08/19/2014   HCT 30.7* 08/19/2014   MCV 95.6 08/19/2014   PLT 186 08/19/2014      Chemistry      Component Value Date/Time   NA 133* 08/19/2014 1400   NA 131* 06/03/2014 0947   K 3.9 08/19/2014 1400   K 3.5 06/03/2014 0947   CL 105 08/19/2014 1400   CL 102 06/03/2014 0947   CO2 23 08/19/2014 1400   CO2 23 06/03/2014 0947   BUN 12 08/19/2014 1400   BUN 12 06/03/2014 0947   CREATININE 0.80 08/19/2014 1400   CREATININE 0.88 06/03/2014 0947      Component Value Date/Time   CALCIUM 8.6* 08/19/2014 1400   CALCIUM 8.8* 06/03/2014 0947   ALKPHOS 82 08/19/2014 1400   ALKPHOS 146* 06/03/2014 0947   AST 27 08/19/2014 1400   AST 74* 06/03/2014 0947   ALT 15 08/19/2014 1400   ALT 47 06/03/2014 0947   BILITOT 0.4 08/19/2014 1400       Lab Results  Component Value Date   LABCA2 70.8* 07/29/2014     STUDIES: No results found.  ASSESSMENT and PlAN:  Stage 4  carcinoma of breast.  PET scan has been reviewed shows stable disease markers are slightly high.  Patient had severe   reaction to carboplatinum. \ Continue eribulin   Forest Gleason, MD   08/19/2014 11:04 PM

## 2014-08-19 NOTE — Progress Notes (Signed)
Patient does not have living will.  Never smokes.

## 2014-08-20 LAB — CANCER ANTIGEN 27.29: CA 27.29: 30.7 U/mL (ref 0.0–38.6)

## 2014-09-02 ENCOUNTER — Inpatient Hospital Stay: Payer: Medicare Other | Attending: Oncology | Admitting: Oncology

## 2014-09-02 ENCOUNTER — Inpatient Hospital Stay: Payer: Medicare Other

## 2014-09-02 VITALS — BP 129/86 | HR 83 | Temp 96.5°F | Wt 172.4 lb

## 2014-09-02 DIAGNOSIS — M069 Rheumatoid arthritis, unspecified: Secondary | ICD-10-CM

## 2014-09-02 DIAGNOSIS — C50912 Malignant neoplasm of unspecified site of left female breast: Secondary | ICD-10-CM | POA: Diagnosis present

## 2014-09-02 DIAGNOSIS — Z923 Personal history of irradiation: Secondary | ICD-10-CM

## 2014-09-02 DIAGNOSIS — I10 Essential (primary) hypertension: Secondary | ICD-10-CM

## 2014-09-02 DIAGNOSIS — C7800 Secondary malignant neoplasm of unspecified lung: Secondary | ICD-10-CM | POA: Diagnosis not present

## 2014-09-02 DIAGNOSIS — Z9221 Personal history of antineoplastic chemotherapy: Secondary | ICD-10-CM

## 2014-09-02 DIAGNOSIS — C787 Secondary malignant neoplasm of liver and intrahepatic bile duct: Secondary | ICD-10-CM

## 2014-09-02 DIAGNOSIS — D649 Anemia, unspecified: Secondary | ICD-10-CM | POA: Insufficient documentation

## 2014-09-02 LAB — CBC WITH DIFFERENTIAL/PLATELET
Basophils Absolute: 0 10*3/uL (ref 0–0.1)
Basophils Relative: 0 %
Eosinophils Absolute: 0 10*3/uL (ref 0–0.7)
Eosinophils Relative: 0 %
HCT: 31 % — ABNORMAL LOW (ref 35.0–47.0)
Hemoglobin: 10 g/dL — ABNORMAL LOW (ref 12.0–16.0)
LYMPHS ABS: 2.1 10*3/uL (ref 1.0–3.6)
Lymphocytes Relative: 52 %
MCH: 30.2 pg (ref 26.0–34.0)
MCHC: 32.3 g/dL (ref 32.0–36.0)
MCV: 93.6 fL (ref 80.0–100.0)
MONOS PCT: 19 %
Monocytes Absolute: 0.7 10*3/uL (ref 0.2–0.9)
Neutro Abs: 1.1 10*3/uL — ABNORMAL LOW (ref 1.4–6.5)
Neutrophils Relative %: 29 %
Platelets: 207 10*3/uL (ref 150–440)
RBC: 3.32 MIL/uL — AB (ref 3.80–5.20)
RDW: 16.6 % — AB (ref 11.5–14.5)
WBC: 4 10*3/uL (ref 3.6–11.0)

## 2014-09-02 LAB — COMPREHENSIVE METABOLIC PANEL
ALBUMIN: 3.4 g/dL — AB (ref 3.5–5.0)
ALT: 14 U/L (ref 14–54)
AST: 31 U/L (ref 15–41)
Alkaline Phosphatase: 92 U/L (ref 38–126)
Anion gap: 5 (ref 5–15)
BUN: 15 mg/dL (ref 6–20)
CALCIUM: 8.4 mg/dL — AB (ref 8.9–10.3)
CHLORIDE: 105 mmol/L (ref 101–111)
CO2: 25 mmol/L (ref 22–32)
CREATININE: 0.82 mg/dL (ref 0.44–1.00)
GLUCOSE: 124 mg/dL — AB (ref 65–99)
POTASSIUM: 4.1 mmol/L (ref 3.5–5.1)
Sodium: 135 mmol/L (ref 135–145)
Total Bilirubin: 0.5 mg/dL (ref 0.3–1.2)
Total Protein: 7.3 g/dL (ref 6.5–8.1)

## 2014-09-02 MED ORDER — SODIUM CHLORIDE 0.9 % IV SOLN
Freq: Once | INTRAVENOUS | Status: AC
  Administered 2014-09-02: 16:00:00 via INTRAVENOUS
  Filled 2014-09-02: qty 4

## 2014-09-02 MED ORDER — ERIBULIN MESYLATE CHEMO INJECTION 1 MG/2ML
1.1000 mg/m2 | Freq: Once | INTRAVENOUS | Status: AC
  Administered 2014-09-02: 2 mg via INTRAVENOUS
  Filled 2014-09-02: qty 4

## 2014-09-02 MED ORDER — SODIUM CHLORIDE 0.9 % IV SOLN
1.1000 mg/m2 | Freq: Once | INTRAVENOUS | Status: DC
  Filled 2014-09-02: qty 4

## 2014-09-02 MED ORDER — SODIUM CHLORIDE 0.9 % IV SOLN
Freq: Once | INTRAVENOUS | Status: AC
  Administered 2014-09-02: 16:00:00 via INTRAVENOUS
  Filled 2014-09-02: qty 1000

## 2014-09-02 MED ORDER — HEPARIN SOD (PORK) LOCK FLUSH 100 UNIT/ML IV SOLN
500.0000 [IU] | Freq: Once | INTRAVENOUS | Status: AC | PRN
Start: 2014-09-02 — End: 2014-09-02
  Administered 2014-09-02: 500 [IU]
  Filled 2014-09-02: qty 5

## 2014-09-02 NOTE — Progress Notes (Signed)
Patient does not have living will.  Never smoked. 

## 2014-09-12 ENCOUNTER — Encounter: Payer: Self-pay | Admitting: Oncology

## 2014-09-12 NOTE — Progress Notes (Signed)
[name] @ [facility] [phone]  [phone]     Beth Flores OB: [birth year]  MR#: [id number]  WUX#:[id number]  Patient Care Team: Jodi Marble, MD as PCP - General (Internal Medicine) Forest Gleason, MD (Unknown Physician Specialty) Christene Lye, MD (General Surgery) Julieanne Manson Leeanne Mannan., MD as Consulting Physician (Rheumatology)  CHIEF COMPLAINT:  Chief Complaint  Patient presents with  . Follow-up    Oncology History   1. Carcinoma of breast (left) diagnosis on June 14, by a core needle biopsy and lymph node biopsy. Estrogen receptor negative, progesterone receptor negative, HER-2 receptor negative. 2. Status post lumpectomy (January, 2012), 0.9 cm tumor, one positive lymph node.  Margins may be involved. AJCC Staging: [id number] Stage Grouping: status postchemotherapy and radiation treatment. 3.  MyRisk genetic mutation study is negative for any mutation. 4.  Patient also has had due to Rheumatoid arthritis on methotrexate tablet. 5.  PET scan shows progressive disease with multiple liver metastases and lung metastases (November, 18th, 2015)  6.  Patient was started on chemotherapy with carboplatinum /abraxene  day 1 day 8 schedule biopsy from jugular lymph node  was positive for metastatic breast cancer.         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 Patient could not get day 8 chemotherapy because of  low blood count as well as upper respiratory tract infection.  Patient had some ulcers in the mouth.  But gradually improving.  No soreness in the mouth.  General condition is improving.  Year for further evaluation and continuation of chemotherapy . Patient is here for ongoing evaluation and continuation of revealing therapy  No chills.  No fever.   Anemia persist.  Weakness persists.  Left breast is irregular mass  No tingling numbness.   Patient is here for ongoing evaluation regarding continuation of   eribulin   therapy. REVIEW OF SYSTEMS:   ROS  general status: Patient is feeling weak and tired.  No change in a performance status.  No chills.  No fever. HEENT: Alopecia.  No evidence of stomatitis Lungs: No cough or shortness of breath Cardiac: No chest pain or paroxysmal nocturnal dyspnea GI: No nausea no vomiting no diarrhea no abdominal pain Skin: No rash Lower extremity no swelling Neurological system: No tingling.  No numbness.  No other focal signs Musculoskeletal system no bony pains  As per HPI. Otherwise, a complete review of systems is negatve.  PAST MEDICAL HISTORY: Past Medical History  Diagnosis Date  . Rheumatoid arthritis   . Cancer 2012    breast   . Breast cancer   . Hypertension   . Cancer of left breast, stage 4 06/25/2014    PAST SURGICAL HISTORY: Past Surgical History  Procedure Laterality Date  . Breast surgery  2010    lumpectomy  .  Tubal ligation  1993  . Carpal tunnel release Bilateral   . Portacath placement  01/19/14    FAMILY HISTORY Family History  Problem Relation Age of Onset  . Cancer Father     colon        ADVANCED DIRECTIVES:  patient was advised and discuss her living will.  All available resources were offered   HEALTH MAINTENANCE: History  Substance Use Topics  . Smoking status: Never Smoker   . Smokeless tobacco: Never Used  . Alcohol Use: No      Allergies  Allergen Reactions  . Carboplatin Shortness Of Breath     Other reaction(s): Tight chest (finding)    Current Outpatient Prescriptions  Medication Sig Dispense Refill  . Ferrous Sulfate (SLOW FE) 142 (45 FE) MG TBCR Take 1 tablet by mouth daily.    . fluticasone (FLONASE) 50 MCG/ACT nasal spray 1 spray as needed for allergies.   0  . folic acid (FOLVITE) 1 MG tablet Take 1 mg by mouth daily.    . hydrochlorothiazide (MICROZIDE) 12.5 MG capsule Take 12.5 mg by mouth daily.    . hydroxychloroquine (PLAQUENIL) 200 MG tablet Take 200 mg by mouth daily.    Marland Kitchen ibuprofen (ADVIL,MOTRIN) 800 MG tablet Take 800 mg by mouth every 8 (eight) hours as needed.    . lidocaine-prilocaine (EMLA) cream Apply 1 application topically as needed. 30 min prior to accessing port 30 g 3  . loratadine (CLARITIN) 10 MG tablet Take 10 mg by mouth daily.    . magnesium chloride (SLOW-MAG) 64 MG TBEC SR tablet Take 1 tablet (64 mg total) by mouth 2 (two) times daily. 60 tablet 3  . nystatin (MYCOSTATIN) 100000 UNIT/ML suspension Take 5 mLs (500,000 Units total) by mouth 4 (four) times daily. 120 mL 0  . pantoprazole (PROTONIX) 40 MG tablet Take 40 mg by mouth daily.    . promethazine (PHENERGAN) 25 MG tablet Take 25 mg by mouth every 6 (six) hours as needed for nausea or vomiting.    . traMADol (ULTRAM) 50 MG tablet Take 50 mg by mouth every 6 (six) hours as needed for moderate pain.     No current facility-administered medications for this visit.   Facility-Administered Medications Ordered in Other Visits  Medication Dose Route Frequency Provider Last Rate Last Dose  . ondansetron (ZOFRAN) 8 mg, dexamethasone (DECADRON) 10 mg in sodium chloride 0.9 % 50 mL IVPB   Intravenous Once Forest Gleason, MD      . sodium chloride 0.9 % injection 10 mL  10 mL Intracatheter PRN Forest Gleason, MD        OBJECTIVE: Filed Vitals:   09/02/14 1442  BP: 129/86  Pulse: 83  Temp: 96.5 F (35.8 C)     Body mass index is 33.67 kg/(m^2).    ECOG FS:1 - Symptomatic but completely  ambulatory  Physical Exam general status: Performance status is good.  Patient has not lost significant weight HEENT: No evidence of stomatitis. Sclera and conjunctivae :: No jaundice.   pale looking.alopecia Lungs: Air  entry equal on both sides.  No rhonchi.  No rales.  Cardiac: Heart sounds are normal.  No pericardial rub.  No murmur. Lymphatic system: Cervical, axillary, inguinal, lymph nodes not palpable GI: Abdomen is soft.  No ascites.  Liver spleen not palpable.  No tenderness.  Bowel sounds are within normal limit Lower extremity: No edema Neurological system: Higher functions, cranial nerves intact no evidence of peripheral neuropathy. Skin: No rash.  No ecchymosis.. Examination of left breast shows nodular growth.  Right breast free of masses  LAB RESULTS:     Component Value Date/Time   NA 135 09/02/2014 1412   NA 131* 06/03/2014 0947   K 4.1 09/02/2014 1412   K 3.5 06/03/2014 0947   CL 105 09/02/2014 1412   CL 102 06/03/2014 0947   CO2 25 09/02/2014 1412   CO2 23 06/03/2014 0947   GLUCOSE 124* 09/02/2014 1412   GLUCOSE 131* 06/03/2014 0947   BUN 15 09/02/2014 1412   BUN 12 06/03/2014 0947   CREATININE 0.82 09/02/2014 1412   CREATININE 0.88 06/03/2014 0947   CALCIUM 8.4* 09/02/2014 1412   CALCIUM 8.8* 06/03/2014 0947   PROT 7.3 09/02/2014 1412   PROT 7.8 06/03/2014 0947   ALBUMIN 3.4* 09/02/2014 1412   ALBUMIN 3.3* 06/03/2014 0947   AST 31 09/02/2014 1412   AST 74* 06/03/2014 0947   ALT 14 09/02/2014 1412   ALT 47 06/03/2014 0947   ALKPHOS 92 09/02/2014 1412   ALKPHOS 146* 06/03/2014 0947   BILITOT 0.5 09/02/2014 1412   BILITOT 0.8 06/03/2014 0947   GFRNONAA >60 09/02/2014 1412   GFRNONAA >60 06/03/2014 0947   GFRAA >60 09/02/2014 1412   GFRAA >60 06/03/2014 0947    No results found for: SPEP, UPEP  Lab Results  Component Value Date   WBC 4.0 09/02/2014   NEUTROABS 1.1* 09/02/2014   HGB 10.0* 09/02/2014   HCT 31.0* 09/02/2014   MCV 93.6  09/02/2014   PLT 207 09/02/2014      Chemistry      Component Value Date/Time   NA 135 09/02/2014 1412   NA 131* 06/03/2014 0947   K 4.1 09/02/2014 1412   K 3.5 06/03/2014 0947   CL 105 09/02/2014 1412   CL 102 06/03/2014 0947   CO2 25 09/02/2014 1412   CO2 23 06/03/2014 0947   BUN 15 09/02/2014 1412   BUN 12 06/03/2014 0947   CREATININE 0.82 09/02/2014 1412   CREATININE 0.88 06/03/2014 0947      Component Value Date/Time   CALCIUM 8.4* 09/02/2014 1412   CALCIUM 8.8* 06/03/2014 0947   ALKPHOS 92 09/02/2014 1412   ALKPHOS 146* 06/03/2014 0947   AST 31 09/02/2014 1412   AST 74* 06/03/2014 0947   ALT 14 09/02/2014 1412   ALT 47 06/03/2014 0947   BILITOT 0.5 09/02/2014 1412   BILITOT 0.8 06/03/2014 0947       Lab Results  Component Value Date   LABCA2 30.7 08/19/2014    Lab Results  Component Value Date   LABCA2 30.7 08/19/2014    ASSESSMENT and PlAN:  Stage 4  carcinoma of breast.  PET scan has been reviewed shows stable disease markers are slightly high.  Patient had severe   reaction to carboplatinum. \ Continue eribulin  Tumor markers are stable.  Patient does have anemia.  Patient is mildly myelosuppressive but will continue chemotherapy Jenny Reichmann is afebrile  Forest Gleason, MD   09/12/2014 12:45 PM

## 2014-09-16 ENCOUNTER — Inpatient Hospital Stay: Payer: Medicare Other

## 2014-09-16 ENCOUNTER — Inpatient Hospital Stay (HOSPITAL_BASED_OUTPATIENT_CLINIC_OR_DEPARTMENT_OTHER): Payer: Medicare Other | Admitting: Oncology

## 2014-09-16 ENCOUNTER — Encounter: Payer: Self-pay | Admitting: Oncology

## 2014-09-16 VITALS — BP 124/81 | HR 87 | Temp 95.5°F | Ht 60.0 in | Wt 172.4 lb

## 2014-09-16 DIAGNOSIS — C50912 Malignant neoplasm of unspecified site of left female breast: Secondary | ICD-10-CM | POA: Diagnosis not present

## 2014-09-16 DIAGNOSIS — C7800 Secondary malignant neoplasm of unspecified lung: Secondary | ICD-10-CM | POA: Diagnosis not present

## 2014-09-16 DIAGNOSIS — D649 Anemia, unspecified: Secondary | ICD-10-CM

## 2014-09-16 DIAGNOSIS — Z923 Personal history of irradiation: Secondary | ICD-10-CM

## 2014-09-16 DIAGNOSIS — I10 Essential (primary) hypertension: Secondary | ICD-10-CM

## 2014-09-16 DIAGNOSIS — M069 Rheumatoid arthritis, unspecified: Secondary | ICD-10-CM

## 2014-09-16 DIAGNOSIS — C787 Secondary malignant neoplasm of liver and intrahepatic bile duct: Secondary | ICD-10-CM | POA: Diagnosis not present

## 2014-09-16 DIAGNOSIS — Z9221 Personal history of antineoplastic chemotherapy: Secondary | ICD-10-CM

## 2014-09-16 LAB — CBC WITH DIFFERENTIAL/PLATELET
BASOS ABS: 0 10*3/uL (ref 0–0.1)
Basophils Relative: 1 %
Eosinophils Absolute: 0 10*3/uL (ref 0–0.7)
Eosinophils Relative: 0 %
HEMATOCRIT: 30.4 % — AB (ref 35.0–47.0)
Hemoglobin: 9.9 g/dL — ABNORMAL LOW (ref 12.0–16.0)
LYMPHS PCT: 49 %
Lymphs Abs: 2.3 10*3/uL (ref 1.0–3.6)
MCH: 29.5 pg (ref 26.0–34.0)
MCHC: 32.5 g/dL (ref 32.0–36.0)
MCV: 90.8 fL (ref 80.0–100.0)
MONO ABS: 0.8 10*3/uL (ref 0.2–0.9)
Monocytes Relative: 17 %
NEUTROS ABS: 1.5 10*3/uL (ref 1.4–6.5)
NEUTROS PCT: 33 %
Platelets: 201 10*3/uL (ref 150–440)
RBC: 3.35 MIL/uL — AB (ref 3.80–5.20)
RDW: 17.1 % — ABNORMAL HIGH (ref 11.5–14.5)
WBC: 4.6 10*3/uL (ref 3.6–11.0)

## 2014-09-16 LAB — COMPREHENSIVE METABOLIC PANEL
ALT: 18 U/L (ref 14–54)
ANION GAP: 4 — AB (ref 5–15)
AST: 31 U/L (ref 15–41)
Albumin: 3.5 g/dL (ref 3.5–5.0)
Alkaline Phosphatase: 97 U/L (ref 38–126)
BUN: 19 mg/dL (ref 6–20)
CALCIUM: 8.3 mg/dL — AB (ref 8.9–10.3)
CO2: 24 mmol/L (ref 22–32)
Chloride: 104 mmol/L (ref 101–111)
Creatinine, Ser: 0.87 mg/dL (ref 0.44–1.00)
Glucose, Bld: 157 mg/dL — ABNORMAL HIGH (ref 65–99)
Potassium: 3.8 mmol/L (ref 3.5–5.1)
Sodium: 132 mmol/L — ABNORMAL LOW (ref 135–145)
Total Bilirubin: 0.5 mg/dL (ref 0.3–1.2)
Total Protein: 7.4 g/dL (ref 6.5–8.1)

## 2014-09-16 MED ORDER — ERIBULIN MESYLATE CHEMO INJECTION 1 MG/2ML
1.1000 mg/m2 | Freq: Once | INTRAVENOUS | Status: AC
  Administered 2014-09-16: 2 mg via INTRAVENOUS
  Filled 2014-09-16: qty 4

## 2014-09-16 MED ORDER — HEPARIN SOD (PORK) LOCK FLUSH 100 UNIT/ML IV SOLN
500.0000 [IU] | Freq: Once | INTRAVENOUS | Status: AC
  Administered 2014-09-16: 500 [IU] via INTRAVENOUS
  Filled 2014-09-16: qty 5

## 2014-09-16 MED ORDER — SODIUM CHLORIDE 0.9 % IV SOLN
Freq: Once | INTRAVENOUS | Status: AC
  Administered 2014-09-16: 16:00:00 via INTRAVENOUS
  Filled 2014-09-16: qty 1000

## 2014-09-16 MED ORDER — SODIUM CHLORIDE 0.9 % IJ SOLN
10.0000 mL | INTRAMUSCULAR | Status: DC | PRN
  Administered 2014-09-16: 10 mL via INTRAVENOUS
  Filled 2014-09-16: qty 10

## 2014-09-16 MED ORDER — SODIUM CHLORIDE 0.9 % IV SOLN
Freq: Once | INTRAVENOUS | Status: AC
  Administered 2014-09-16: 16:00:00 via INTRAVENOUS
  Filled 2014-09-16: qty 4

## 2014-09-17 LAB — CANCER ANTIGEN 27.29: CA 27.29: 17.3 U/mL (ref 0.0–38.6)

## 2014-09-18 NOTE — Progress Notes (Signed)
[place] @ [facility] [phone]  [phone]) [phone]     Georjean Toya OB: [birth year]  MR#: [id number]  KZL#:[id number]  Patient Care Team: Jodi Marble, MD as PCP - General (Internal Medicine) Forest Gleason, MD (Unknown Physician Specialty) Christene Lye, MD (General Surgery) Julieanne Manson Leeanne Mannan., MD as Consulting Physician (Rheumatology)  CHIEF COMPLAINT:  Chief Complaint  Patient presents with  . Follow-up    breast cancer    Oncology History   1. Carcinoma of breast (left) diagnosis on June 14, by a core needle biopsy and lymph node biopsy. Estrogen receptor negative, progesterone receptor negative, HER-2 receptor negative. 2. Status post lumpectomy (January, 2012), 0.9 cm tumor, one positive lymph node.  Margins may be involved. AJCC Staging: [id number]_N1_M_0 Stage Grouping: status postchemotherapy and radiation treatment. 3.  MyRisk genetic mutation study is negative for any mutation. 4.  Patient also has had due to Rheumatoid arthritis on methotrexate tablet. 5.  PET scan shows progressive disease with multiple liver metastases and lung metastases (November, 18th, 2015)  6.  Patient was started on chemotherapy with carboplatinum /abraxene  day 1 day 8 schedule biopsy from jugular lymph node  was positive for metastatic breast cancer.         Cancer of left breast, stage 4   06/25/2014 Initial Diagnosis Cancer of left breast, stage 4    Oncology Flowsheet 06/30/2014 07/15/2014 08/05/2014 08/19/2014 09/02/2014 09/16/2014  Day, Cycle Day 1, Cycle 1 Day 8, Cycle 1 Day 8, Cycle 2 - Day 8, Cycle 3 Day 1, Cycle 4  dexamethasone (DECADRON) IV [ 10 mg ] [ 10 mg ] [ 10 mg ] - [ 10 mg ] [ 10 mg ]  eriBULin mesylate (HALAVEN) IV 1.4 mg/m2 1.1 mg/m2 2 mg 1.1 mg/m2 1.1 mg/m2 1.1 mg/m2  ondansetron (ZOFRAN) IV [ 8 mg ] [ 8 mg ] [ 8 mg ] - [ 8 mg ] [ 8 mg ]    INTERVAL HISTORY  54 year old lady here for further follow-up and continuation of treatment.  No chills.  No  fever.  No nausea.  No vomiting.  No diarrhea.  No abdominal pain. Patient has stage IV carcinoma of breast.  On    ERIBULIN   treatment  REVIEW OF SYSTEMS:   ROS  general status: Patient is feeling weak and tired.  No change in a performance status.  No chills.  No fever. HEENT: Alopecia.  No evidence of stomatitis Lungs: No cough or shortness of breath Cardiac: No chest pain or paroxysmal nocturnal dyspnea GI: No nausea no vomiting no diarrhea no abdominal pain Skin: No rash Lower extremity no swelling Neurological system: No tingling.  No numbness.  No other focal signs Musculoskeletal system no bony pains  As per HPI. Otherwise, a complete review of systems is negatve.  PAST MEDICAL HISTORY: Past Medical History  Diagnosis Date  . Rheumatoid arthritis   . Cancer 2012    breast   . Breast cancer   . Hypertension   . Cancer of left breast, stage 4 06/25/2014    PAST SURGICAL HISTORY: Past Surgical History  Procedure Laterality Date  . Breast surgery  2010    lumpectomy  . Tubal ligation  1993  . Carpal tunnel release Bilateral   . Portacath placement  01/19/14    FAMILY HISTORY Family History  Problem Relation Age of Onset  . Cancer Father     colon        ADVANCED DIRECTIVES:  patient  was advised and discuss her living will.  All available resources were offered   HEALTH MAINTENANCE: History  Substance Use Topics  . Smoking status: Never Smoker   . Smokeless tobacco: Never Used  . Alcohol Use: No      Allergies  Allergen Reactions  . Carboplatin Shortness Of Breath    Other reaction(s): Tight chest (finding)    Current Outpatient Prescriptions  Medication Sig Dispense Refill  . Ferrous Sulfate (SLOW FE) 142 (45 FE) MG TBCR Take 1 tablet by mouth daily.    . fluticasone (FLONASE) 50 MCG/ACT nasal spray 1 spray as needed for allergies.   0  . folic acid (FOLVITE) 1 MG tablet Take 1 mg by mouth daily.    . hydrochlorothiazide (MICROZIDE) 12.5 MG  capsule Take 12.5 mg by mouth daily.    Marland Kitchen ibuprofen (ADVIL,MOTRIN) 800 MG tablet Take 800 mg by mouth every 8 (eight) hours as needed.    . lidocaine-prilocaine (EMLA) cream Apply 1 application topically as needed. 30 min prior to accessing port 30 g 3  . loratadine (CLARITIN) 10 MG tablet Take 10 mg by mouth daily.    . magnesium chloride (SLOW-MAG) 64 MG TBEC SR tablet Take 1 tablet (64 mg total) by mouth 2 (two) times daily. 60 tablet 3  . nystatin (MYCOSTATIN) 100000 UNIT/ML suspension Take 5 mLs (500,000 Units total) by mouth 4 (four) times daily. 120 mL 0  . pantoprazole (PROTONIX) 40 MG tablet Take 40 mg by mouth daily.    . promethazine (PHENERGAN) 25 MG tablet Take 25 mg by mouth every 6 (six) hours as needed for nausea or vomiting.    . traMADol (ULTRAM) 50 MG tablet Take 50 mg by mouth every 6 (six) hours as needed for moderate pain.    . hydroxychloroquine (PLAQUENIL) 200 MG tablet   0   No current facility-administered medications for this visit.   Facility-Administered Medications Ordered in Other Visits  Medication Dose Route Frequency Provider Last Rate Last Dose  . ondansetron (ZOFRAN) 8 mg, dexamethasone (DECADRON) 10 mg in sodium chloride 0.9 % 50 mL IVPB   Intravenous Once Forest Gleason, MD      . sodium chloride 0.9 % injection 10 mL  10 mL Intracatheter PRN Forest Gleason, MD        OBJECTIVE: Filed Vitals:   09/16/14 1534  BP: 124/81  Pulse: 87  Temp: 95.5 F (35.3 C)     Body mass index is 33.67 kg/(m^2).    ECOG FS:1 - Symptomatic but completely ambulatory  Physical Exam general status: Performance status is good.  Patient has not lost significant weight HEENT: No evidence of stomatitis. Sclera and conjunctivae :: No jaundice.   pale looking.alopecia Lungs: Air  entry equal on both sides.  No rhonchi.  No rales.  Cardiac: Heart sounds are normal.  No pericardial rub.  No murmur. Lymphatic system: Cervical, axillary, inguinal, lymph nodes not palpable GI: Abdomen  is soft.  No ascites.  Liver spleen not palpable.  No tenderness.  Bowel sounds are within normal limit Lower extremity: No edema Neurological system: Higher functions, cranial nerves intact no evidence of peripheral neuropathy. Skin: No rash.  No ecchymosis.. Examination of left breast shows nodular growth.  Right breast free of masses  LAB RESULTS: Component     Latest Ref Rng 01/06/2014 05/13/2014 06/03/2014 06/24/2014 07/15/2014  CA 27.29     0.0 - 38.6 U/mL 285.6 (H) 152.5 (H) 135.1 (H) 142.9 (H) 108.5 (H)   Component  Latest Ref Rng 07/29/2014 08/19/2014 09/16/2014  CA 27.29     0.0 - 38.6 U/mL 70.8 (H) 30.7 17.3    No results found for: SPEP, UPEP  Lab Results  Component Value Date   WBC 4.6 09/16/2014   NEUTROABS 1.5 09/16/2014   HGB 9.9* 09/16/2014   HCT 30.4* 09/16/2014   MCV 90.8 09/16/2014   PLT 201 09/16/2014      Chemistry      Component Value Date/Time   NA 132* 09/16/2014 1406   NA 131* 06/03/2014 0947   K 3.8 09/16/2014 1406   K 3.5 06/03/2014 0947   CL 104 09/16/2014 1406   CL 102 06/03/2014 0947   CO2 24 09/16/2014 1406   CO2 23 06/03/2014 0947   BUN 19 09/16/2014 1406   BUN 12 06/03/2014 0947   CREATININE 0.87 09/16/2014 1406   CREATININE 0.88 06/03/2014 0947      Component Value Date/Time   CALCIUM 8.3* 09/16/2014 1406   CALCIUM 8.8* 06/03/2014 0947   ALKPHOS 97 09/16/2014 1406   ALKPHOS 146* 06/03/2014 0947   AST 31 09/16/2014 1406   AST 74* 06/03/2014 0947   ALT 18 09/16/2014 1406   ALT 47 06/03/2014 0947   BILITOT 0.5 09/16/2014 1406   BILITOT 0.8 06/03/2014 0947       Lab Results  Component Value Date   LABCA2 17.3 09/16/2014    Lab Results  Component Value Date   LABCA2 17.3 09/16/2014    ASSESSMENT and PlAN:  Stage 4  carcinoma of breast.  PET scan has been reviewed shows stable disease markers are slightly high.  Patient had severe   reaction to carboplatinum. \ Continue eribulin  By tumor marker scraped area patient is  responding to chemotherapy Anemia multifactorial Rheumatoid arthritis  Forest Gleason, MD   09/18/2014 8:25 AM

## 2014-09-20 ENCOUNTER — Other Ambulatory Visit: Payer: Self-pay | Admitting: Oncology

## 2014-09-30 ENCOUNTER — Inpatient Hospital Stay: Payer: Medicare Other

## 2014-09-30 ENCOUNTER — Ambulatory Visit: Payer: Medicare Other | Admitting: Oncology

## 2014-09-30 ENCOUNTER — Other Ambulatory Visit: Payer: Medicare Other

## 2014-09-30 ENCOUNTER — Ambulatory Visit: Payer: Medicare Other

## 2014-09-30 ENCOUNTER — Encounter: Payer: Self-pay | Admitting: Oncology

## 2014-09-30 ENCOUNTER — Inpatient Hospital Stay: Payer: Medicare Other | Attending: Oncology | Admitting: Oncology

## 2014-09-30 VITALS — BP 116/80 | HR 83 | Temp 96.9°F | Wt 171.5 lb

## 2014-09-30 DIAGNOSIS — C50912 Malignant neoplasm of unspecified site of left female breast: Secondary | ICD-10-CM | POA: Diagnosis not present

## 2014-09-30 DIAGNOSIS — Z9221 Personal history of antineoplastic chemotherapy: Secondary | ICD-10-CM | POA: Diagnosis not present

## 2014-09-30 DIAGNOSIS — C787 Secondary malignant neoplasm of liver and intrahepatic bile duct: Secondary | ICD-10-CM | POA: Insufficient documentation

## 2014-09-30 DIAGNOSIS — C770 Secondary and unspecified malignant neoplasm of lymph nodes of head, face and neck: Secondary | ICD-10-CM | POA: Diagnosis not present

## 2014-09-30 DIAGNOSIS — Z171 Estrogen receptor negative status [ER-]: Secondary | ICD-10-CM

## 2014-09-30 DIAGNOSIS — M069 Rheumatoid arthritis, unspecified: Secondary | ICD-10-CM | POA: Insufficient documentation

## 2014-09-30 DIAGNOSIS — D649 Anemia, unspecified: Secondary | ICD-10-CM | POA: Diagnosis not present

## 2014-09-30 DIAGNOSIS — Z800 Family history of malignant neoplasm of digestive organs: Secondary | ICD-10-CM

## 2014-09-30 DIAGNOSIS — I10 Essential (primary) hypertension: Secondary | ICD-10-CM | POA: Insufficient documentation

## 2014-09-30 DIAGNOSIS — Z79899 Other long term (current) drug therapy: Secondary | ICD-10-CM | POA: Insufficient documentation

## 2014-09-30 DIAGNOSIS — Z923 Personal history of irradiation: Secondary | ICD-10-CM | POA: Diagnosis not present

## 2014-09-30 DIAGNOSIS — C7800 Secondary malignant neoplasm of unspecified lung: Secondary | ICD-10-CM | POA: Insufficient documentation

## 2014-09-30 DIAGNOSIS — Z5111 Encounter for antineoplastic chemotherapy: Secondary | ICD-10-CM | POA: Insufficient documentation

## 2014-09-30 LAB — CBC WITH DIFFERENTIAL/PLATELET
BASOS PCT: 0 %
Basophils Absolute: 0 10*3/uL (ref 0–0.1)
EOS ABS: 0 10*3/uL (ref 0–0.7)
Eosinophils Relative: 0 %
HCT: 30.6 % — ABNORMAL LOW (ref 35.0–47.0)
Hemoglobin: 10.1 g/dL — ABNORMAL LOW (ref 12.0–16.0)
Lymphocytes Relative: 52 %
Lymphs Abs: 1.9 10*3/uL (ref 1.0–3.6)
MCH: 29 pg (ref 26.0–34.0)
MCHC: 33 g/dL (ref 32.0–36.0)
MCV: 87.8 fL (ref 80.0–100.0)
Monocytes Absolute: 0.7 10*3/uL (ref 0.2–0.9)
Monocytes Relative: 19 %
NEUTROS PCT: 29 %
Neutro Abs: 1.1 10*3/uL — ABNORMAL LOW (ref 1.4–6.5)
Platelets: 189 10*3/uL (ref 150–440)
RBC: 3.49 MIL/uL — ABNORMAL LOW (ref 3.80–5.20)
RDW: 17.8 % — AB (ref 11.5–14.5)
WBC: 3.7 10*3/uL (ref 3.6–11.0)

## 2014-09-30 LAB — BASIC METABOLIC PANEL
ANION GAP: 5 (ref 5–15)
BUN: 15 mg/dL (ref 6–20)
CHLORIDE: 105 mmol/L (ref 101–111)
CO2: 23 mmol/L (ref 22–32)
CREATININE: 0.81 mg/dL (ref 0.44–1.00)
Calcium: 8.4 mg/dL — ABNORMAL LOW (ref 8.9–10.3)
Glucose, Bld: 142 mg/dL — ABNORMAL HIGH (ref 65–99)
Potassium: 3.9 mmol/L (ref 3.5–5.1)
SODIUM: 133 mmol/L — AB (ref 135–145)

## 2014-09-30 MED ORDER — SODIUM CHLORIDE 0.9 % IV SOLN
1.1000 mg/m2 | Freq: Once | INTRAVENOUS | Status: AC
  Administered 2014-09-30: 2 mg via INTRAVENOUS
  Filled 2014-09-30: qty 4

## 2014-09-30 MED ORDER — HEPARIN SOD (PORK) LOCK FLUSH 100 UNIT/ML IV SOLN
500.0000 [IU] | Freq: Once | INTRAVENOUS | Status: AC
  Administered 2014-09-30: 500 [IU] via INTRAVENOUS
  Filled 2014-09-30: qty 5

## 2014-09-30 MED ORDER — SODIUM CHLORIDE 0.9 % IV SOLN
Freq: Once | INTRAVENOUS | Status: AC
  Administered 2014-09-30: 10:00:00 via INTRAVENOUS
  Filled 2014-09-30: qty 4

## 2014-09-30 MED ORDER — HEPARIN SOD (PORK) LOCK FLUSH 100 UNIT/ML IV SOLN: 500.0000 [IU] | Freq: Once | INTRAVENOUS | Status: DC | PRN

## 2014-09-30 MED ORDER — SODIUM CHLORIDE 0.9 % IJ SOLN
10.0000 mL | INTRAMUSCULAR | Status: DC | PRN
  Administered 2014-09-30: 10 mL via INTRAVENOUS
  Filled 2014-09-30: qty 10

## 2014-09-30 MED ORDER — SODIUM CHLORIDE 0.9 % IV SOLN
Freq: Once | INTRAVENOUS | Status: AC
  Administered 2014-09-30: 10:00:00 via INTRAVENOUS
  Filled 2014-09-30: qty 1000

## 2014-09-30 NOTE — Progress Notes (Signed)
[place] @ [facility] [phone]) [phone]  Fax:(336) [phone]     Beth Flores OB: [birth year]  MR#: [id number]  EPP#:[id number]  Patient Care Team: Jodi Marble, MD as PCP - General (Internal Medicine) Forest Gleason, MD (Unknown Physician Specialty) Christene Lye, MD (General Surgery) Julieanne Manson Leeanne Mannan., MD as Consulting Physician (Rheumatology)  CHIEF COMPLAINT:  Chief Complaint  Patient presents with  . Follow-up    Oncology History   1. Carcinoma of breast (left) diagnosis on June 14, by a core needle biopsy and lymph node biopsy. Estrogen receptor negative, progesterone receptor negative, HER-2 receptor negative. 2. Status post lumpectomy (January, 2012), 0.9 cm tumor, one positive lymph node.  Margins may be involved. AJCC Staging: [id number] Stage Grouping: status postchemotherapy and radiation treatment. 3.  MyRisk genetic mutation study is negative for any mutation. 4.  Patient also has had due to Rheumatoid arthritis on methotrexate tablet. 5.  PET scan shows progressive disease with multiple liver metastases and lung metastases (November, 18th, 2015)  6.  Patient was started on chemotherapy with carboplatinum /abraxene  day 1 day 8 schedule biopsy from jugular lymph node  was positive for metastatic breast cancer.         Cancer of left breast, stage 4   06/25/2014 Initial Diagnosis Cancer of left breast, stage 4    Oncology Flowsheet 06/30/2014 07/15/2014 08/05/2014 08/19/2014 09/02/2014 09/16/2014  Day, Cycle Day 1, Cycle 1 Day 8, Cycle 1 Day 8, Cycle 2 - Day 8, Cycle 3 Day 1, Cycle 4  dexamethasone (DECADRON) IV [ 10 mg ] [ 10 mg ] [ 10 mg ] - [ 10 mg ] [ 10 mg ]  eriBULin mesylate (HALAVEN) IV 1.4 mg/m2 1.1 mg/m2 2 mg 1.1 mg/m2 1.1 mg/m2 1.1 mg/m2  ondansetron (ZOFRAN) IV [ 8 mg ] [ 8 mg ] [ 8 mg ] - [ 8 mg ] [ 8 mg ]    INTERVAL HISTORY  54 year old lady here for further follow-up and continuation of treatment.  No chills.  No fever.  No  nausea.  No vomiting.  No diarrhea.  No abdominal pain. Patient has stage IV carcinoma of breast.  On    ERIBULIN   Treatment. September 30, 2014 Patient is here for ongoing evaluation treatment consideration.  Significant improvement in performance status and abdominal pain.  No nausea no vomiting no diarrhea.  Alopecia.  No evidence of stomatitis.  REVIEW OF SYSTEMS:   ROS  general status: Patient is feeling weak and tired.  No change in a performance status.  No chills.  No fever. HEENT: Alopecia.  No evidence of stomatitis Lungs: No cough or shortness of breath Cardiac: No chest pain or paroxysmal nocturnal dyspnea GI: No nausea no vomiting no diarrhea no abdominal pain Skin: No rash Lower extremity no swelling Neurological system: No tingling.  No numbness.  No other focal signs Musculoskeletal system no bony pains  As per HPI. Otherwise, a complete review of systems is negatve.  PAST MEDICAL HISTORY: Past Medical History  Diagnosis Date  . Rheumatoid arthritis   . Cancer 2012    breast   . Breast cancer   . Hypertension   . Cancer of left breast, stage 4 06/25/2014    PAST SURGICAL HISTORY: Past Surgical History  Procedure Laterality Date  . Breast surgery  2010    lumpectomy  . Tubal ligation  1993  . Carpal tunnel release Bilateral   . Portacath placement  01/19/14    FAMILY  HISTORY Family History  Problem Relation Age of Onset  . Cancer Father     colon        ADVANCED DIRECTIVES:  patient was advised and discuss her living will.  All available resources were offered   HEALTH MAINTENANCE: Social History  Substance Use Topics  . Smoking status: Never Smoker   . Smokeless tobacco: Never Used  . Alcohol Use: No      Allergies  Allergen Reactions  . Carboplatin Shortness Of Breath    Other reaction(s): Tight chest (finding)    Current Outpatient Prescriptions  Medication Sig Dispense Refill  . Ferrous Sulfate (SLOW FE) 142 (45 FE) MG TBCR Take 1  tablet by mouth daily.    . fluticasone (FLONASE) 50 MCG/ACT nasal spray 1 spray as needed for allergies.   0  . folic acid (FOLVITE) 1 MG tablet Take 1 mg by mouth daily.    . hydrochlorothiazide (MICROZIDE) 12.5 MG capsule Take 12.5 mg by mouth daily.    . hydroxychloroquine (PLAQUENIL) 200 MG tablet   0  . ibuprofen (ADVIL,MOTRIN) 800 MG tablet Take 800 mg by mouth every 8 (eight) hours as needed.    . lidocaine-prilocaine (EMLA) cream Apply 1 application topically as needed. 30 min prior to accessing port 30 g 3  . loratadine (CLARITIN) 10 MG tablet Take 10 mg by mouth daily.    . magnesium chloride (SLOW-MAG) 64 MG TBEC SR tablet Take 1 tablet (64 mg total) by mouth 2 (two) times daily. 60 tablet 3  . nystatin (MYCOSTATIN) 100000 UNIT/ML suspension take 5 milliliters by mouth four times a day 120 mL 0  . pantoprazole (PROTONIX) 40 MG tablet Take 40 mg by mouth daily.    . promethazine (PHENERGAN) 25 MG tablet Take 25 mg by mouth every 6 (six) hours as needed for nausea or vomiting.    . traMADol (ULTRAM) 50 MG tablet Take 50 mg by mouth every 6 (six) hours as needed for moderate pain.     No current facility-administered medications for this visit.   Facility-Administered Medications Ordered in Other Visits  Medication Dose Route Frequency Provider Last Rate Last Dose  . heparin lock flush 100 unit/mL  500 Units Intravenous Once Forest Gleason, MD      . ondansetron (ZOFRAN) 8 mg, dexamethasone (DECADRON) 10 mg in sodium chloride 0.9 % 50 mL IVPB   Intravenous Once Forest Gleason, MD      . sodium chloride 0.9 % injection 10 mL  10 mL Intracatheter PRN Forest Gleason, MD      . sodium chloride 0.9 % injection 10 mL  10 mL Intravenous PRN Forest Gleason, MD   10 mL at 09/30/14 0838    OBJECTIVE: Filed Vitals:   09/30/14 0901  BP: 116/80  Pulse: 83  Temp: 96.9 F (36.1 C)     Body mass index is 33.49 kg/(m^2).    ECOG FS:1 - Symptomatic but completely ambulatory  Physical Exam general  status: Performance status is good.  Patient has not lost significant weight HEENT: No evidence of stomatitis. Sclera and conjunctivae :: No jaundice.   pale looking.alopecia Lungs: Air  entry equal on both sides.  No rhonchi.  No rales.  Cardiac: Heart sounds are normal.  No pericardial rub.  No murmur. Lymphatic system: Cervical, axillary, inguinal, lymph nodes not palpable GI: Abdomen is soft.  No ascites.  Liver spleen not palpable.  No tenderness.  Bowel sounds are within normal limit Lower extremity: No edema Neurological  system: Higher functions, cranial nerves intact no evidence of peripheral neuropathy. Skin: No rash.  No ecchymosis.. Examination of left breast shows nodular growth.  Right breast free of masses  LAB RESULTS: Component     Latest Ref Rng 01/06/2014 05/13/2014 06/03/2014 06/24/2014 07/15/2014  CA 27.29     0.0 - 38.6 U/mL 285.6 (H) 152.5 (H) 135.1 (H) 142.9 (H) 108.5 (H)   Component     Latest Ref Rng 07/29/2014 08/19/2014 09/16/2014  CA 27.29     0.0 - 38.6 U/mL 70.8 (H) 30.7 17.3      Lab Results  Component Value Date   WBC 3.7 09/30/2014   NEUTROABS 1.1* 09/30/2014   HGB 10.1* 09/30/2014   HCT 30.6* 09/30/2014   MCV 87.8 09/30/2014   PLT 189 09/30/2014      Chemistry      Component Value Date/Time   NA 133* 09/30/2014 0838   NA 131* 06/03/2014 0947   K 3.9 09/30/2014 0838   K 3.5 06/03/2014 0947   CL 105 09/30/2014 0838   CL 102 06/03/2014 0947   CO2 23 09/30/2014 0838   CO2 23 06/03/2014 0947   BUN 15 09/30/2014 0838   BUN 12 06/03/2014 0947   CREATININE 0.81 09/30/2014 0838   CREATININE 0.88 06/03/2014 0947      Component Value Date/Time   CALCIUM 8.4* 09/30/2014 0838   CALCIUM 8.8* 06/03/2014 0947   ALKPHOS 97 09/16/2014 1406   ALKPHOS 146* 06/03/2014 0947   AST 31 09/16/2014 1406   AST 74* 06/03/2014 0947   ALT 18 09/16/2014 1406   ALT 47 06/03/2014 0947   BILITOT 0.5 09/16/2014 1406   BILITOT 0.8 06/03/2014 0947       Lab Results   Component Value Date   LABCA2 17.3 09/16/2014    Lab Results  Component Value Date   LABCA2 17.3 09/16/2014    ASSESSMENT and PlAN:  Stage 4  carcinoma of breast.  PET scan has been reviewed shows stable disease markers are slightly high.  Patient had severe   reaction to carboplatinum. \ Continue eribulin  By tumor marker scraped area patient is responding to chemotherapy Anemia multifactorial Rheumatoid arthritis Last CT scan was done in May.  Will repeat that in September of 2016  Forest Gleason, MD   09/30/2014 9:27 AM

## 2014-09-30 NOTE — Progress Notes (Signed)
Patient does not have living will.  Never smoked. 

## 2014-10-14 ENCOUNTER — Inpatient Hospital Stay: Payer: Medicare Other

## 2014-10-14 ENCOUNTER — Other Ambulatory Visit: Payer: Medicare Other

## 2014-10-14 ENCOUNTER — Encounter: Payer: Self-pay | Admitting: Oncology

## 2014-10-14 ENCOUNTER — Inpatient Hospital Stay (HOSPITAL_BASED_OUTPATIENT_CLINIC_OR_DEPARTMENT_OTHER): Payer: Medicare Other | Admitting: Oncology

## 2014-10-14 ENCOUNTER — Ambulatory Visit: Payer: Medicare Other

## 2014-10-14 ENCOUNTER — Ambulatory Visit: Payer: Medicare Other | Admitting: Oncology

## 2014-10-14 VITALS — BP 124/87 | HR 75 | Temp 96.4°F | Wt 175.0 lb

## 2014-10-14 DIAGNOSIS — C787 Secondary malignant neoplasm of liver and intrahepatic bile duct: Secondary | ICD-10-CM | POA: Diagnosis not present

## 2014-10-14 DIAGNOSIS — Z800 Family history of malignant neoplasm of digestive organs: Secondary | ICD-10-CM

## 2014-10-14 DIAGNOSIS — Z79899 Other long term (current) drug therapy: Secondary | ICD-10-CM

## 2014-10-14 DIAGNOSIS — C7800 Secondary malignant neoplasm of unspecified lung: Secondary | ICD-10-CM | POA: Diagnosis not present

## 2014-10-14 DIAGNOSIS — M069 Rheumatoid arthritis, unspecified: Secondary | ICD-10-CM

## 2014-10-14 DIAGNOSIS — C770 Secondary and unspecified malignant neoplasm of lymph nodes of head, face and neck: Secondary | ICD-10-CM | POA: Diagnosis not present

## 2014-10-14 DIAGNOSIS — D649 Anemia, unspecified: Secondary | ICD-10-CM

## 2014-10-14 DIAGNOSIS — Z171 Estrogen receptor negative status [ER-]: Secondary | ICD-10-CM

## 2014-10-14 DIAGNOSIS — C50912 Malignant neoplasm of unspecified site of left female breast: Secondary | ICD-10-CM

## 2014-10-14 DIAGNOSIS — Z923 Personal history of irradiation: Secondary | ICD-10-CM

## 2014-10-14 DIAGNOSIS — I10 Essential (primary) hypertension: Secondary | ICD-10-CM

## 2014-10-14 DIAGNOSIS — Z9221 Personal history of antineoplastic chemotherapy: Secondary | ICD-10-CM

## 2014-10-14 DIAGNOSIS — Z5111 Encounter for antineoplastic chemotherapy: Secondary | ICD-10-CM | POA: Diagnosis not present

## 2014-10-14 LAB — CBC WITH DIFFERENTIAL/PLATELET
Basophils Absolute: 0 10*3/uL (ref 0–0.1)
Basophils Relative: 1 %
EOS ABS: 0 10*3/uL (ref 0–0.7)
Eosinophils Relative: 0 %
HCT: 29.5 % — ABNORMAL LOW (ref 35.0–47.0)
HEMOGLOBIN: 9.6 g/dL — AB (ref 12.0–16.0)
LYMPHS ABS: 2 10*3/uL (ref 1.0–3.6)
Lymphocytes Relative: 45 %
MCH: 28.1 pg (ref 26.0–34.0)
MCHC: 32.7 g/dL (ref 32.0–36.0)
MCV: 86 fL (ref 80.0–100.0)
Monocytes Absolute: 0.9 10*3/uL (ref 0.2–0.9)
Monocytes Relative: 21 %
NEUTROS ABS: 1.4 10*3/uL (ref 1.4–6.5)
Neutrophils Relative %: 33 %
Platelets: 192 10*3/uL (ref 150–440)
RBC: 3.43 MIL/uL — ABNORMAL LOW (ref 3.80–5.20)
RDW: 18 % — ABNORMAL HIGH (ref 11.5–14.5)
WBC: 4.4 10*3/uL (ref 3.6–11.0)

## 2014-10-14 LAB — COMPREHENSIVE METABOLIC PANEL
ALBUMIN: 3.4 g/dL — AB (ref 3.5–5.0)
ALK PHOS: 87 U/L (ref 38–126)
ALT: 17 U/L (ref 14–54)
AST: 26 U/L (ref 15–41)
Anion gap: 4 — ABNORMAL LOW (ref 5–15)
BUN: 15 mg/dL (ref 6–20)
CALCIUM: 8.3 mg/dL — AB (ref 8.9–10.3)
CO2: 24 mmol/L (ref 22–32)
CREATININE: 0.8 mg/dL (ref 0.44–1.00)
Chloride: 105 mmol/L (ref 101–111)
GFR calc Af Amer: >60 mL/min (ref >60)
GFR calc non Af Amer: >60 mL/min (ref >60)
GLUCOSE: 97 mg/dL (ref 65–99)
Potassium: 3.9 mmol/L (ref 3.5–5.1)
SODIUM: 133 mmol/L — AB (ref 135–145)
Total Bilirubin: 0.4 mg/dL (ref 0.3–1.2)
Total Protein: 7.1 g/dL (ref 6.5–8.1)

## 2014-10-14 MED ORDER — SODIUM CHLORIDE 0.9 % IV SOLN
Freq: Once | INTRAVENOUS | Status: AC
  Administered 2014-10-14: 12:00:00 via INTRAVENOUS
  Filled 2014-10-14: qty 4

## 2014-10-14 MED ORDER — HEPARIN SOD (PORK) LOCK FLUSH 100 UNIT/ML IV SOLN
500.0000 [IU] | Freq: Once | INTRAVENOUS | Status: AC | PRN
  Administered 2014-10-14: 500 [IU]

## 2014-10-14 MED ORDER — SODIUM CHLORIDE 0.9 % IV SOLN
Freq: Once | INTRAVENOUS | Status: AC
  Administered 2014-10-14: 11:00:00 via INTRAVENOUS
  Filled 2014-10-14: qty 1000

## 2014-10-14 MED ORDER — HEPARIN SOD (PORK) LOCK FLUSH 100 UNIT/ML IV SOLN
INTRAVENOUS | Status: AC
  Filled 2014-10-14: qty 5

## 2014-10-14 MED ORDER — ERIBULIN MESYLATE CHEMO INJECTION 1 MG/2ML
1.1000 mg/m2 | Freq: Once | INTRAVENOUS | Status: AC
  Administered 2014-10-14: 2 mg via INTRAVENOUS
  Filled 2014-10-14: qty 4

## 2014-10-14 NOTE — Progress Notes (Signed)
Orders to hold tx if anc less than 1.5, pt 's anc 1.4 spoke with MD's nurse and proceed with tx

## 2014-10-14 NOTE — Progress Notes (Signed)
Patient does not have living will.  Never smoked. 

## 2014-10-15 ENCOUNTER — Encounter: Payer: Self-pay | Admitting: Oncology

## 2014-10-15 LAB — CANCER ANTIGEN 27.29: CA 27.29: 20.7 U/mL (ref 0.0–38.6)

## 2014-10-15 NOTE — Progress Notes (Signed)
[name] @ [facility] [phone]) [phone]  [phone]     Beth Flores OB: 1960/05/11  MR#: [id number]  ALP#:[id number]  Patient Care Team: Jodi Marble, MD as PCP - General (Internal Medicine) Forest Gleason, MD (Unknown Physician Specialty) Christene Lye, MD (General Surgery) Julieanne Manson Leeanne Mannan., MD as Consulting Physician (Rheumatology)  CHIEF COMPLAINT:  Chief Complaint  Patient presents with  . Follow-up    Oncology History   1. Carcinoma of breast (left) diagnosis on June 14, by a core needle biopsy and lymph node biopsy. Estrogen receptor negative, progesterone receptor negative, HER-2 receptor negative. 2. Status post lumpectomy (January, 2012), 0.9 cm tumor, one positive lymph node.  Margins may be involved. AJCC Staging: [id number]_N1_M_0 Stage Grouping: status postchemotherapy and radiation treatment. 3.  MyRisk genetic mutation study is negative for any mutation. 4.  Patient also has had due to Rheumatoid arthritis on methotrexate tablet. 5.  PET scan shows progressive disease with multiple liver metastases and lung metastases (November, 18th, 2015)  6.  Patient was started on chemotherapy with carboplatinum /abraxene  day 1 day 8 schedule biopsy from jugular lymph node  was positive for metastatic breast cancer. 7.  Because of poor tolerance to chemotherapy with carboplatinum and Taxol patient was started on ERIBULIN from May of 2016         Cancer of left breast, stage 4   06/25/2014 Initial Diagnosis Cancer of left breast, stage 4    Breast CA   08/03/2013 Initial Diagnosis Breast CA    Oncology Flowsheet 07/15/2014 08/05/2014 08/19/2014 09/02/2014 09/16/2014 09/30/2014 10/14/2014  Day, Cycle Day 8, Cycle 1 Day 8, Cycle 2 - Day 8, Cycle 3 Day 1, Cycle 4 Day 8, Cycle 4 Day 1, 6  dexamethasone (DECADRON) IV [ 10 mg ] [ 10 mg ] - [ 10 mg ] [ 10 mg ] [ 10 mg ] [ 10 mg ]  eriBULin mesylate (HALAVEN) IV 1.1 mg/m2 2 mg 1.1 mg/m2 1.1 mg/m2 1.1 mg/m2 1.1  mg/m2 1.1 mg/m2  ondansetron (ZOFRAN) IV [ 8 mg ] [ 8 mg ] - [ 8 mg ] [ 8 mg ] [ 8 mg ] [ 8 mg ]    INTERVAL HISTORY  54 year old lady here for further follow-up and continuation of treatment.  No chills.  No fever.  No nausea.  No vomiting.  No diarrhea.  No abdominal pain. Patient has stage IV carcinoma of breast.  On    ERIBULIN   Treatment. September 30, 2014 Patient is here for ongoing evaluation treatment consideration.  Significant improvement in performance status and abdominal pain.  No nausea no vomiting no diarrhea.  Alopecia.  No evidence of stomatitis. October 14, 2014 Patient is here for the next cycle of ERIBULN.  Tolerating treatment very well.  No chills.  No fever.  No nausea.  In general status is improved.  REVIEW OF SYSTEMS:   ROS  general status: Patient is feeling somewhat stronger.Marland Kitchen  No change in a performance status.  No chills.  No fever. HEENT: Alopecia.  No evidence of stomatitis Lungs: No cough or shortness of breath Cardiac: No chest pain or paroxysmal nocturnal dyspnea GI: No nausea no vomiting no diarrhea no abdominal pain Skin: No rash Lower extremity no swelling Neurological system: No tingling.  No numbness.  No other focal signs Musculoskeletal system no bony pains  As per HPI. Otherwise, a complete review of systems is negatve.  PAST MEDICAL HISTORY: Past Medical History  Diagnosis Date  .  Rheumatoid arthritis   . Cancer 2012    breast   . Breast cancer   . Hypertension   . Cancer of left breast, stage 4 06/25/2014    PAST SURGICAL HISTORY: Past Surgical History  Procedure Laterality Date  . Breast surgery  2010    lumpectomy  . Tubal ligation  1993  . Carpal tunnel release Bilateral   . Portacath placement  01/19/14    FAMILY HISTORY Family History  Problem Relation Age of Onset  . Cancer Father     colon        ADVANCED DIRECTIVES:  patient was advised and discuss her living will.  All available resources were  offered   HEALTH MAINTENANCE: Social History  Substance Use Topics  . Smoking status: Never Smoker   . Smokeless tobacco: Never Used  . Alcohol Use: No      Allergies  Allergen Reactions  . Carboplatin Shortness Of Breath    Other reaction(s): Tight chest (finding)    Current Outpatient Prescriptions  Medication Sig Dispense Refill  . Ferrous Sulfate (SLOW FE) 142 (45 FE) MG TBCR Take 1 tablet by mouth daily.    . fluticasone (FLONASE) 50 MCG/ACT nasal spray 1 spray as needed for allergies.   0  . folic acid (FOLVITE) 1 MG tablet Take 1 mg by mouth daily.    . hydrochlorothiazide (MICROZIDE) 12.5 MG capsule Take 12.5 mg by mouth daily.    . hydroxychloroquine (PLAQUENIL) 200 MG tablet   0  . ibuprofen (ADVIL,MOTRIN) 800 MG tablet Take 800 mg by mouth every 8 (eight) hours as needed.    . lidocaine-prilocaine (EMLA) cream Apply 1 application topically as needed. 30 min prior to accessing port 30 g 3  . loratadine (CLARITIN) 10 MG tablet Take 10 mg by mouth daily.    . magnesium chloride (SLOW-MAG) 64 MG TBEC SR tablet Take 1 tablet (64 mg total) by mouth 2 (two) times daily. 60 tablet 3  . nystatin (MYCOSTATIN) 100000 UNIT/ML suspension take 5 milliliters by mouth four times a day 120 mL 0  . pantoprazole (PROTONIX) 40 MG tablet Take 40 mg by mouth daily.    . promethazine (PHENERGAN) 25 MG tablet Take 25 mg by mouth every 6 (six) hours as needed for nausea or vomiting.    . traMADol (ULTRAM) 50 MG tablet Take 50 mg by mouth every 6 (six) hours as needed for moderate pain.     No current facility-administered medications for this visit.   Facility-Administered Medications Ordered in Other Visits  Medication Dose Route Frequency Provider Last Rate Last Dose  . ondansetron (ZOFRAN) 8 mg, dexamethasone (DECADRON) 10 mg in sodium chloride 0.9 % 50 mL IVPB   Intravenous Once Forest Gleason, MD      . sodium chloride 0.9 % injection 10 mL  10 mL Intracatheter PRN Forest Gleason, MD         OBJECTIVE: Filed Vitals:   10/14/14 1008  BP: 124/87  Pulse: 75  Temp: 96.4 F (35.8 C)     Body mass index is 34.19 kg/(m^2).    ECOG FS:1 - Symptomatic but completely ambulatory  Physical Exam general status: Performance status is good.  Patient has not lost significant weight HEENT: No evidence of stomatitis. Sclera and conjunctivae :: No jaundice.   pale looking.alopecia Lungs: Air  entry equal on both sides.  No rhonchi.  No rales.  Cardiac: Heart sounds are normal.  No pericardial rub.  No murmur. Lymphatic system: Cervical,  axillary, inguinal, lymph nodes not palpable GI: Abdomen is soft.  No ascites.  Liver spleen not palpable.  No tenderness.  Bowel sounds are within normal limit Lower extremity: No edema Neurological system: Higher functions, cranial nerves intact no evidence of peripheral neuropathy. Skin: No rash.  No ecchymosis.. Examination of left breast shows nodular growth.  Right breast free of masses  LAB RESULTS: Component     Latest Ref Rng 01/06/2014 05/13/2014 06/03/2014 06/24/2014 07/15/2014  CA 27.29     0.0 - 38.6 U/mL 285.6 (H) 152.5 (H) 135.1 (H) 142.9 (H) 108.5 (H)   Component     Latest Ref Rng 07/29/2014 08/19/2014 09/16/2014  CA 27.29     0.0 - 38.6 U/mL 70.8 (H) 30.7 17.3      Lab Results  Component Value Date   WBC 4.4 10/14/2014   NEUTROABS 1.4 10/14/2014   HGB 9.6* 10/14/2014   HCT 29.5* 10/14/2014   MCV 86.0 10/14/2014   PLT 192 10/14/2014      Chemistry         Lab Results  Component Value Date   LABCA2 17.3 09/16/2014      ASSESSMENT and PlAN:  Stage 4  carcinoma of breast.  PET scan has been reviewed shows stable disease markers are slightly high.  Patient had severe   reaction to carboplatinum. \ Continue eribulin   Anemia multifactorial Rheumatoid arthritis Last CT scan was done in May.  Will repeat that in September of 2016 Tumor markers are being rechecked  Forest Gleason, MD   10/15/2014 9:26 AM

## 2014-10-28 ENCOUNTER — Encounter: Payer: Self-pay | Admitting: Oncology

## 2014-10-28 ENCOUNTER — Inpatient Hospital Stay (HOSPITAL_BASED_OUTPATIENT_CLINIC_OR_DEPARTMENT_OTHER): Payer: Medicare Other | Admitting: Oncology

## 2014-10-28 ENCOUNTER — Inpatient Hospital Stay: Payer: Medicare Other

## 2014-10-28 ENCOUNTER — Inpatient Hospital Stay: Payer: Medicare Other | Attending: Oncology

## 2014-10-28 VITALS — BP 115/76 | HR 81 | Temp 96.8°F | Wt 176.1 lb

## 2014-10-28 DIAGNOSIS — M069 Rheumatoid arthritis, unspecified: Secondary | ICD-10-CM | POA: Diagnosis not present

## 2014-10-28 DIAGNOSIS — C7800 Secondary malignant neoplasm of unspecified lung: Secondary | ICD-10-CM | POA: Insufficient documentation

## 2014-10-28 DIAGNOSIS — Z5111 Encounter for antineoplastic chemotherapy: Secondary | ICD-10-CM | POA: Diagnosis present

## 2014-10-28 DIAGNOSIS — C787 Secondary malignant neoplasm of liver and intrahepatic bile duct: Secondary | ICD-10-CM | POA: Diagnosis not present

## 2014-10-28 DIAGNOSIS — Z800 Family history of malignant neoplasm of digestive organs: Secondary | ICD-10-CM | POA: Diagnosis not present

## 2014-10-28 DIAGNOSIS — I10 Essential (primary) hypertension: Secondary | ICD-10-CM | POA: Insufficient documentation

## 2014-10-28 DIAGNOSIS — Z171 Estrogen receptor negative status [ER-]: Secondary | ICD-10-CM | POA: Diagnosis not present

## 2014-10-28 DIAGNOSIS — D649 Anemia, unspecified: Secondary | ICD-10-CM | POA: Diagnosis not present

## 2014-10-28 DIAGNOSIS — C50912 Malignant neoplasm of unspecified site of left female breast: Secondary | ICD-10-CM | POA: Insufficient documentation

## 2014-10-28 LAB — CBC WITH DIFFERENTIAL/PLATELET
BASOS PCT: 1 %
Basophils Absolute: 0 10*3/uL (ref 0–0.1)
EOS ABS: 0 10*3/uL (ref 0–0.7)
Eosinophils Relative: 0 %
HEMATOCRIT: 31.2 % — AB (ref 35.0–47.0)
Hemoglobin: 10.1 g/dL — ABNORMAL LOW (ref 12.0–16.0)
Lymphocytes Relative: 47 %
Lymphs Abs: 1.9 10*3/uL (ref 1.0–3.6)
MCH: 27.5 pg (ref 26.0–34.0)
MCHC: 32.3 g/dL (ref 32.0–36.0)
MCV: 85.1 fL (ref 80.0–100.0)
MONO ABS: 0.7 10*3/uL (ref 0.2–0.9)
MONOS PCT: 17 %
Neutro Abs: 1.4 10*3/uL (ref 1.4–6.5)
Neutrophils Relative %: 35 %
Platelets: 186 10*3/uL (ref 150–440)
RBC: 3.67 MIL/uL — ABNORMAL LOW (ref 3.80–5.20)
RDW: 18.9 % — AB (ref 11.5–14.5)
WBC: 4 10*3/uL (ref 3.6–11.0)

## 2014-10-28 LAB — COMPREHENSIVE METABOLIC PANEL
ALBUMIN: 3.3 g/dL — AB (ref 3.5–5.0)
ALT: 18 U/L (ref 14–54)
ANION GAP: 6 (ref 5–15)
AST: 31 U/L (ref 15–41)
Alkaline Phosphatase: 105 U/L (ref 38–126)
BILIRUBIN TOTAL: 0.4 mg/dL (ref 0.3–1.2)
BUN: 14 mg/dL (ref 6–20)
CO2: 23 mmol/L (ref 22–32)
Calcium: 8.4 mg/dL — ABNORMAL LOW (ref 8.9–10.3)
Chloride: 108 mmol/L (ref 101–111)
Creatinine, Ser: 0.72 mg/dL (ref 0.44–1.00)
GFR calc Af Amer: >60 mL/min (ref >60)
GFR calc non Af Amer: >60 mL/min (ref >60)
GLUCOSE: 136 mg/dL — AB (ref 65–99)
POTASSIUM: 4.1 mmol/L (ref 3.5–5.1)
SODIUM: 137 mmol/L (ref 135–145)
TOTAL PROTEIN: 6.8 g/dL (ref 6.5–8.1)

## 2014-10-28 LAB — MAGNESIUM: Magnesium: 1.8 mg/dL (ref 1.7–2.4)

## 2014-10-28 MED ORDER — SODIUM CHLORIDE 0.9 % IV SOLN
1.1000 mg/m2 | Freq: Once | INTRAVENOUS | Status: AC
  Administered 2014-10-28: 2 mg via INTRAVENOUS
  Filled 2014-10-28: qty 4

## 2014-10-28 MED ORDER — SODIUM CHLORIDE 0.9 % IV SOLN
Freq: Once | INTRAVENOUS | Status: AC
  Administered 2014-10-28: 10:00:00 via INTRAVENOUS
  Filled 2014-10-28: qty 1000

## 2014-10-28 MED ORDER — HEPARIN SOD (PORK) LOCK FLUSH 100 UNIT/ML IV SOLN
500.0000 [IU] | Freq: Once | INTRAVENOUS | Status: AC | PRN
  Administered 2014-10-28: 500 [IU]
  Filled 2014-10-28: qty 5

## 2014-10-28 MED ORDER — DEXAMETHASONE SODIUM PHOSPHATE 100 MG/10ML IJ SOLN
Freq: Once | INTRAMUSCULAR | Status: AC
  Administered 2014-10-28: 11:00:00 via INTRAVENOUS
  Filled 2014-10-28: qty 4

## 2014-10-28 MED ORDER — SODIUM CHLORIDE 0.9 % IJ SOLN
10.0000 mL | INTRAMUSCULAR | Status: DC | PRN
  Administered 2014-10-28: 10 mL
  Filled 2014-10-28: qty 10

## 2014-10-28 NOTE — Progress Notes (Signed)
[place] @ [facility] Telephone:(336) [phone]  Fax:(336) [phone]     Altha Sweitzer OB: [birth year]  MR#: [id number]  AOZ#:[id number]  Patient Care Team: Jodi Marble, MD as PCP - General (Internal Medicine) Forest Gleason, MD (Unknown Physician Specialty) Christene Lye, MD (General Surgery) Julieanne Manson Leeanne Mannan., MD as Consulting Physician (Rheumatology)  CHIEF COMPLAINT:  Chief Complaint  Patient presents with  . Follow-up    Oncology History   1. Carcinoma of breast (left) diagnosis on June 14, by a core needle biopsy and lymph node biopsy. Estrogen receptor negative, progesterone receptor negative, HER-2 receptor negative. 2. Status post lumpectomy (January, 2012), 0.9 cm tumor, one positive lymph node.  Margins may be involved. AJCC Staging: [id number]_0 Stage Grouping: status postchemotherapy and radiation treatment. 3.  MyRisk genetic mutation study is negative for any mutation. 4.  Patient also has had due to Rheumatoid arthritis on methotrexate tablet. 5.  PET scan shows progressive disease with multiple liver metastases and lung metastases (November, 18th, 2015)  6.  Patient was started on chemotherapy with carboplatinum /abraxene  day 1 day 8 schedule biopsy from jugular lymph node  was positive for metastatic breast cancer. 7.  Because of poor tolerance to chemotherapy with carboplatinum and Taxol patient was started on ERIBULIN from May of 2016           Oncology Flowsheet 07/15/2014 08/05/2014 08/19/2014 09/02/2014 09/16/2014 09/30/2014 10/14/2014  Day, Cycle Day 8, Cycle 1 Day 8, Cycle 2 - Day 8, Cycle 3 Day 1, Cycle 4 Day 8, Cycle 4 Day 1, 6  dexamethasone (DECADRON) IV [ 10 mg ] [ 10 mg ] - [ 10 mg ] [ 10 mg ] [ 10 mg ] [ 10 mg ]  eriBULin mesylate (HALAVEN) IV 1.1 mg/m2 2 mg 1.1 mg/m2 1.1 mg/m2 1.1 mg/m2 1.1 mg/m2 1.1 mg/m2  ondansetron (ZOFRAN) IV [ 8 mg ] [ 8 mg ] - [ 8 mg ] [ 8 mg ] [ 8 mg ] [ 8 mg ]    INTERVAL HISTORY  54 year old lady here for  further follow-up and continuation of treatment.  No chills.  No fever.  No nausea.  No vomiting.  No diarrhea.  No abdominal pain. Patient has stage IV carcinoma of breast.  On    ERIBULIN   Treatment. September 30, 2014 Patient is here for ongoing evaluation treatment consideration.  Significant improvement in performance status and abdominal pain.  No nausea no vomiting no diarrhea.  Alopecia.  No evidence of stomatitis. October 14, 2014 Patient is here for the next cycle of ERIBULN.  Tolerating treatment very well.  No chills.  No fever.  No nausea.  In general status is improved October 28, 2014 Patient is here for ongoing evaluation and treatment consideration.  No chills.  No fever.  No nausea.  No vomiting.  No diarrhea.  Energy level is improved. PATIENT  is here for ongoing evaluation and treatment consideration.  REVIEW OF SYSTEMS:   ROS  general status: Patient is feeling somewhat stronger.Marland Kitchen  No change in a performance status.  No chills.  No fever. HEENT: Alopecia.  No evidence of stomatitis Lungs: No cough or shortness of breath Cardiac: No chest pain or paroxysmal nocturnal dyspnea GI: No nausea no vomiting no diarrhea no abdominal pain Skin: No rash Lower extremity no swelling Neurological system: No tingling.  No numbness.  No other focal signs Musculoskeletal system no bony pains  As per HPI. Otherwise, a complete review of systems  is negatve.  PAST MEDICAL HISTORY: Past Medical History  Diagnosis Date  . Rheumatoid arthritis   . Cancer 2012    breast   . Breast cancer   . Hypertension   . Cancer of left breast, stage 4 06/25/2014    PAST SURGICAL HISTORY: Past Surgical History  Procedure Laterality Date  . Breast surgery  2010    lumpectomy  . Tubal ligation  1993  . Carpal tunnel release Bilateral   . Portacath placement  01/19/14    FAMILY HISTORY Family History  Problem Relation Age of Onset  . Cancer Father     colon        ADVANCED DIRECTIVES:   patient was advised and discuss her living will.  All available resources were offered   HEALTH MAINTENANCE: Social History  Substance Use Topics  . Smoking status: Never Smoker   . Smokeless tobacco: Never Used  . Alcohol Use: No      Allergies  Allergen Reactions  . Carboplatin Shortness Of Breath    Other reaction(s): Tight chest (finding)    Current Outpatient Prescriptions  Medication Sig Dispense Refill  . Ferrous Sulfate (SLOW FE) 142 (45 FE) MG TBCR Take 1 tablet by mouth daily.    . fluticasone (FLONASE) 50 MCG/ACT nasal spray 1 spray as needed for allergies.   0  . folic acid (FOLVITE) 1 MG tablet Take 1 mg by mouth daily.    . hydrochlorothiazide (MICROZIDE) 12.5 MG capsule Take 12.5 mg by mouth daily.    . hydroxychloroquine (PLAQUENIL) 200 MG tablet   0  . ibuprofen (ADVIL,MOTRIN) 800 MG tablet Take 800 mg by mouth every 8 (eight) hours as needed.    . lidocaine-prilocaine (EMLA) cream Apply 1 application topically as needed. 30 min prior to accessing port 30 g 3  . loratadine (CLARITIN) 10 MG tablet Take 10 mg by mouth daily.    . magnesium chloride (SLOW-MAG) 64 MG TBEC SR tablet Take 1 tablet (64 mg total) by mouth 2 (two) times daily. 60 tablet 3  . nystatin (MYCOSTATIN) 100000 UNIT/ML suspension take 5 milliliters by mouth four times a day 120 mL 0  . pantoprazole (PROTONIX) 40 MG tablet Take 40 mg by mouth daily.    . promethazine (PHENERGAN) 25 MG tablet Take 25 mg by mouth every 6 (six) hours as needed for nausea or vomiting.    . traMADol (ULTRAM) 50 MG tablet Take 50 mg by mouth every 6 (six) hours as needed for moderate pain.     No current facility-administered medications for this visit.   Facility-Administered Medications Ordered in Other Visits  Medication Dose Route Frequency Provider Last Rate Last Dose  . heparin lock flush 100 unit/mL  500 Units Intracatheter Once PRN Forest Gleason, MD      . ondansetron (ZOFRAN) 8 mg, dexamethasone (DECADRON) 10  mg in sodium chloride 0.9 % 50 mL IVPB   Intravenous Once Forest Gleason, MD      . sodium chloride 0.9 % injection 10 mL  10 mL Intracatheter PRN Forest Gleason, MD      . sodium chloride 0.9 % injection 10 mL  10 mL Intracatheter PRN Forest Gleason, MD   10 mL at 10/28/14 0843    OBJECTIVE: Filed Vitals:   10/28/14 0918  BP: 115/76  Pulse: 81  Temp: 96.8 F (36 C)     Body mass index is 34.4 kg/(m^2).    ECOG FS:1 - Symptomatic but completely ambulatory  Physical Exam  general status: Performance status is good.  Patient has not lost significant weight HEENT: No evidence of stomatitis. Sclera and conjunctivae :: No jaundice.   pale looking.alopecia Lungs: Air  entry equal on both sides.  No rhonchi.  No rales.  Cardiac: Heart sounds are normal.  No pericardial rub.  No murmur. Lymphatic system: Cervical, axillary, inguinal, lymph nodes not palpable GI: Abdomen is soft.  No ascites.  Liver spleen not palpable.  No tenderness.  Bowel sounds are within normal limit Lower extremity: No edema Neurological system: Higher functions, cranial nerves intact no evidence of peripheral neuropathy. Skin: No rash.  No ecchymosis.. Examination of left breast shows nodular growth.  Right breast free of masses  LAB RESULTS: Component     Latest Ref Rng 01/06/2014 05/13/2014 06/03/2014 06/24/2014 07/15/2014  CA 27.29     0.0 - 38.6 U/mL 285.6 (H) 152.5 (H) 135.1 (H) 142.9 (H) 108.5 (H)   Component     Latest Ref Rng 07/29/2014 08/19/2014 09/16/2014  CA 27.29     0.0 - 38.6 U/mL 70.8 (H) 30.7 17.3    Ref Range 2wk ago    CA 27.29 0.0 - 38.6 U/mL 20.7           Lab Results  Component Value Date   WBC 4.0 10/28/2014   NEUTROABS 1.4 10/28/2014   HGB 10.1* 10/28/2014   HCT 31.2* 10/28/2014   MCV 85.1 10/28/2014   PLT 186 10/28/2014      Chemistry         Lab Results  Component Value Date   LABCA2 20.7 10/14/2014      ASSESSMENT and PlAN:  Stage 4  carcinoma of breast.  PET scan has  been reviewed shows stable disease markers are slightly high.  Patient had severe   reaction to carboplatinum. \ Continue eribulin   Anemia multifactorial Rheumatoid arthritis Last CT scan was done in May.  Will repeat that in September of 2016 Tumor markers are being rechecked  Forest Gleason, MD   10/28/2014 10:08 AM

## 2014-10-28 NOTE — Progress Notes (Signed)
Patient does not have living will.  Never smoked. 

## 2014-10-29 ENCOUNTER — Encounter: Payer: Self-pay | Admitting: Oncology

## 2014-11-11 ENCOUNTER — Encounter: Payer: Self-pay | Admitting: Oncology

## 2014-11-11 ENCOUNTER — Inpatient Hospital Stay: Payer: Medicare Other

## 2014-11-11 ENCOUNTER — Inpatient Hospital Stay (HOSPITAL_BASED_OUTPATIENT_CLINIC_OR_DEPARTMENT_OTHER): Payer: Medicare Other | Admitting: Oncology

## 2014-11-11 VITALS — BP 128/77 | HR 85 | Temp 96.3°F | Wt 176.0 lb

## 2014-11-11 DIAGNOSIS — M069 Rheumatoid arthritis, unspecified: Secondary | ICD-10-CM

## 2014-11-11 DIAGNOSIS — D649 Anemia, unspecified: Secondary | ICD-10-CM | POA: Diagnosis not present

## 2014-11-11 DIAGNOSIS — C7800 Secondary malignant neoplasm of unspecified lung: Secondary | ICD-10-CM | POA: Diagnosis not present

## 2014-11-11 DIAGNOSIS — C787 Secondary malignant neoplasm of liver and intrahepatic bile duct: Secondary | ICD-10-CM

## 2014-11-11 DIAGNOSIS — C50912 Malignant neoplasm of unspecified site of left female breast: Secondary | ICD-10-CM

## 2014-11-11 DIAGNOSIS — I10 Essential (primary) hypertension: Secondary | ICD-10-CM

## 2014-11-11 DIAGNOSIS — Z5111 Encounter for antineoplastic chemotherapy: Secondary | ICD-10-CM | POA: Diagnosis not present

## 2014-11-11 DIAGNOSIS — Z800 Family history of malignant neoplasm of digestive organs: Secondary | ICD-10-CM

## 2014-11-11 DIAGNOSIS — Z171 Estrogen receptor negative status [ER-]: Secondary | ICD-10-CM

## 2014-11-11 LAB — COMPREHENSIVE METABOLIC PANEL
ALBUMIN: 3.3 g/dL — AB (ref 3.5–5.0)
ALK PHOS: 100 U/L (ref 38–126)
ALT: 18 U/L (ref 14–54)
ANION GAP: 6 (ref 5–15)
AST: 32 U/L (ref 15–41)
BILIRUBIN TOTAL: 0.5 mg/dL (ref 0.3–1.2)
BUN: 14 mg/dL (ref 6–20)
CALCIUM: 8.3 mg/dL — AB (ref 8.9–10.3)
CO2: 22 mmol/L (ref 22–32)
CREATININE: 0.79 mg/dL (ref 0.44–1.00)
Chloride: 105 mmol/L (ref 101–111)
GFR calc Af Amer: >60 mL/min (ref >60)
GFR calc non Af Amer: >60 mL/min (ref >60)
GLUCOSE: 134 mg/dL — AB (ref 65–99)
Potassium: 3.8 mmol/L (ref 3.5–5.1)
SODIUM: 133 mmol/L — AB (ref 135–145)
Total Protein: 6.9 g/dL (ref 6.5–8.1)

## 2014-11-11 LAB — CBC WITH DIFFERENTIAL/PLATELET
BASOS ABS: 0 10*3/uL (ref 0–0.1)
BASOS PCT: 0 %
EOS ABS: 0 10*3/uL (ref 0–0.7)
Eosinophils Relative: 0 %
HEMATOCRIT: 29.8 % — AB (ref 35.0–47.0)
HEMOGLOBIN: 9.6 g/dL — AB (ref 12.0–16.0)
Lymphocytes Relative: 48 %
Lymphs Abs: 2 10*3/uL (ref 1.0–3.6)
MCH: 26.8 pg (ref 26.0–34.0)
MCHC: 32.2 g/dL (ref 32.0–36.0)
MCV: 83 fL (ref 80.0–100.0)
Monocytes Absolute: 0.7 10*3/uL (ref 0.2–0.9)
Monocytes Relative: 18 %
NEUTROS ABS: 1.4 10*3/uL (ref 1.4–6.5)
NEUTROS PCT: 34 %
Platelets: 193 10*3/uL (ref 150–440)
RBC: 3.59 MIL/uL — AB (ref 3.80–5.20)
RDW: 19.2 % — ABNORMAL HIGH (ref 11.5–14.5)
WBC: 4.1 10*3/uL (ref 3.6–11.0)

## 2014-11-11 MED ORDER — ERIBULIN MESYLATE CHEMO INJECTION 1 MG/2ML
1.1000 mg/m2 | Freq: Once | INTRAVENOUS | Status: AC
  Administered 2014-11-11: 2 mg via INTRAVENOUS
  Filled 2014-11-11: qty 4

## 2014-11-11 MED ORDER — INFLUENZA VAC SPLIT QUAD 0.5 ML IM SUSY
0.5000 mL | PREFILLED_SYRINGE | Freq: Once | INTRAMUSCULAR | Status: AC
  Administered 2014-11-11: 0.5 mL via INTRAMUSCULAR
  Filled 2014-11-11: qty 0.5

## 2014-11-11 MED ORDER — HEPARIN SOD (PORK) LOCK FLUSH 100 UNIT/ML IV SOLN
500.0000 [IU] | Freq: Once | INTRAVENOUS | Status: AC
  Administered 2014-11-11: 500 [IU] via INTRAVENOUS

## 2014-11-11 MED ORDER — SODIUM CHLORIDE 0.9 % IJ SOLN
10.0000 mL | INTRAMUSCULAR | Status: DC | PRN
  Administered 2014-11-11: 10 mL via INTRAVENOUS
  Filled 2014-11-11: qty 10

## 2014-11-11 MED ORDER — HEPARIN SOD (PORK) LOCK FLUSH 100 UNIT/ML IV SOLN
500.0000 [IU] | Freq: Once | INTRAVENOUS | Status: AC | PRN
  Administered 2014-11-11: 500 [IU]
  Filled 2014-11-11: qty 5

## 2014-11-11 MED ORDER — SODIUM CHLORIDE 0.9 % IV SOLN
Freq: Once | INTRAVENOUS | Status: AC
  Administered 2014-11-11: 11:00:00 via INTRAVENOUS
  Filled 2014-11-11: qty 1000

## 2014-11-11 MED ORDER — SODIUM CHLORIDE 0.9 % IV SOLN
Freq: Once | INTRAVENOUS | Status: AC
  Administered 2014-11-11: 11:00:00 via INTRAVENOUS
  Filled 2014-11-11: qty 4

## 2014-11-11 NOTE — Progress Notes (Signed)
Patient does not have living will.  Never smoked. 

## 2014-11-11 NOTE — Progress Notes (Signed)
[place] @ [facility] [phone]) [phone]  Fax:(336) [phone]     Tasheka Houseman OB: [birth year]  MR#: [id number]  TZG#:[id number]  Patient Care Team: Jodi Marble, MD as PCP - General (Internal Medicine) Forest Gleason, MD (Unknown Physician Specialty) Christene Lye, MD (General Surgery) Julieanne Manson Leeanne Mannan., MD as Consulting Physician (Rheumatology)  CHIEF COMPLAINT:  Chief Complaint  Patient presents with  . OTHER    Oncology History   1. Carcinoma of breast (left) diagnosis on June 14, by a core needle biopsy and lymph node biopsy. Estrogen receptor negative, progesterone receptor negative, HER-2 receptor negative. 2. Status post lumpectomy (January, 2012), 0.9 cm tumor, one positive lymph node.  Margins may be involved. AJCC Staging: [id number]_N1_M_0 Stage Grouping: status postchemotherapy and radiation treatment. 3.  MyRisk genetic mutation study is negative for any mutation. 4.  Patient also has had due to Rheumatoid arthritis on methotrexate tablet. 5.  PET scan shows progressive disease with multiple liver metastases and lung metastases (November, 18th, 2015)  6.  Patient was started on chemotherapy with carboplatinum /abraxene  day 1 day 8 schedule biopsy from jugular lymph node  was positive for metastatic breast cancer. 7.  Because of poor tolerance to chemotherapy with carboplatinum and Taxol patient was started on ERIBULIN from May of 2016           Oncology Flowsheet 08/05/2014 08/19/2014 09/02/2014 09/16/2014 09/30/2014 10/14/2014 10/28/2014  Day, Cycle Day 8, Cycle 2 - Day 8, Cycle 3 Day 1, Cycle 4 Day 8, Cycle 4 Day 1, 6 Day 8, 6  dexamethasone (DECADRON) IV [ 10 mg ] - [ 10 mg ] [ 10 mg ] [ 10 mg ] [ 10 mg ] [ 10 mg ]  eriBULin mesylate (HALAVEN) IV 2 mg 1.1 mg/m2 1.1 mg/m2 1.1 mg/m2 1.1 mg/m2 1.1 mg/m2 1.1 mg/m2  ondansetron (ZOFRAN) IV [ 8 mg ] - [ 8 mg ] [ 8 mg ] [ 8 mg ] [ 8 mg ] [ 8 mg ]    INTERVAL HISTORY  54 year old African-American lady with  stage IV carcinoma of breast came today further follow-up.  No chills fever.  Tolerating chemotherapy.  Energy level is improved.  No tingling.  No numbness.  Left breast mass has decreased in size drainage is decreased.  No bony pains .  REVIEW OF SYSTEMS:   ROS  general status: Patient is feeling somewhat stronger.Marland Kitchen  No change in a performance status.  No chills.  No fever. HEENT: Alopecia.  No evidence of stomatitis Lungs: No cough or shortness of breath Cardiac: No chest pain or paroxysmal nocturnal dyspnea GI: No nausea no vomiting no diarrhea no abdominal pain Skin: No rash Lower extremity no swelling Neurological system: No tingling.  No numbness.  No other focal signs Musculoskeletal system no bony pains  As per HPI. Otherwise, a complete review of systems is negatve.  PAST MEDICAL HISTORY: Past Medical History  Diagnosis Date  . Rheumatoid arthritis   . Cancer 2012    breast   . Breast cancer   . Hypertension   . Cancer of left breast, stage 4 06/25/2014    PAST SURGICAL HISTORY: Past Surgical History  Procedure Laterality Date  . Breast surgery  2010    lumpectomy  . Tubal ligation  1993  . Carpal tunnel release Bilateral   . Portacath placement  01/19/14    FAMILY HISTORY Family History  Problem Relation Age of Onset  . Cancer Father  colon        ADVANCED DIRECTIVES:  patient was advised and discuss her living will.  All available resources were offered   HEALTH MAINTENANCE: Social History  Substance Use Topics  . Smoking status: Never Smoker   . Smokeless tobacco: Never Used  . Alcohol Use: No      Allergies  Allergen Reactions  . Carboplatin Shortness Of Breath    Other reaction(s): Tight chest (finding)    Current Outpatient Prescriptions  Medication Sig Dispense Refill  . Ferrous Sulfate (SLOW FE) 142 (45 FE) MG TBCR Take 1 tablet by mouth daily.    . fluticasone (FLONASE) 50 MCG/ACT nasal spray 1 spray as needed for allergies.   0   . folic acid (FOLVITE) 1 MG tablet Take 1 mg by mouth daily.    . hydrochlorothiazide (MICROZIDE) 12.5 MG capsule Take 12.5 mg by mouth daily.    . hydroxychloroquine (PLAQUENIL) 200 MG tablet   0  . ibuprofen (ADVIL,MOTRIN) 800 MG tablet Take 800 mg by mouth every 8 (eight) hours as needed.    . lidocaine-prilocaine (EMLA) cream Apply 1 application topically as needed. 30 min prior to accessing port 30 g 3  . loratadine (CLARITIN) 10 MG tablet Take 10 mg by mouth daily.    . magnesium chloride (SLOW-MAG) 64 MG TBEC SR tablet Take 1 tablet (64 mg total) by mouth 2 (two) times daily. 60 tablet 3  . nystatin (MYCOSTATIN) 100000 UNIT/ML suspension take 5 milliliters by mouth four times a day 120 mL 0  . pantoprazole (PROTONIX) 40 MG tablet Take 40 mg by mouth daily.    . promethazine (PHENERGAN) 25 MG tablet Take 25 mg by mouth every 6 (six) hours as needed for nausea or vomiting.    . traMADol (ULTRAM) 50 MG tablet Take 50 mg by mouth every 6 (six) hours as needed for moderate pain.     No current facility-administered medications for this visit.   Facility-Administered Medications Ordered in Other Visits  Medication Dose Route Frequency Provider Last Rate Last Dose  . ondansetron (ZOFRAN) 8 mg, dexamethasone (DECADRON) 10 mg in sodium chloride 0.9 % 50 mL IVPB   Intravenous Once Forest Gleason, MD      . sodium chloride 0.9 % injection 10 mL  10 mL Intracatheter PRN Forest Gleason, MD      . sodium chloride 0.9 % injection 10 mL  10 mL Intravenous PRN Forest Gleason, MD   10 mL at 11/11/14 0922    OBJECTIVE: Filed Vitals:   11/11/14 0953  BP: 128/77  Pulse: 85  Temp: 96.3 F (35.7 C)     Body mass index is 34.37 kg/(m^2).    ECOG FS:1 - Symptomatic but completely ambulatory  Physical Exam general status: Performance status is good.  Patient has not lost significant weight HEENT: No evidence of stomatitis. Sclera and conjunctivae :: No jaundice.   pale looking.alopecia Lungs: Air  entry  equal on both sides.  No rhonchi.  No rales.  Cardiac: Heart sounds are normal.  No pericardial rub.  No murmur. Lymphatic system: Cervical, axillary, inguinal, lymph nodes not palpable GI: Abdomen is soft.  No ascites.  Liver spleen not palpable.  No tenderness.  Bowel sounds are within normal limit Lower extremity: No edema Neurological system: Higher functions, cranial nerves intact no evidence of peripheral neuropathy. Skin: No rash.  No ecchymosis.. Examination of left breast shows nodular growth.  Right breast free of masses  LAB RESULTS: Component  Latest Ref Rng 01/06/2014 05/13/2014 06/03/2014 06/24/2014 07/15/2014  CA 27.29     0.0 - 38.6 U/mL 285.6 (H) 152.5 (H) 135.1 (H) 142.9 (H) 108.5 (H)   Component     Latest Ref Rng 07/29/2014 08/19/2014 09/16/2014  CA 27.29     0.0 - 38.6 U/mL 70.8 (H) 30.7 17.3    Ref Range 2wk ago    CA 27.29 0.0 - 38.6 U/mL 20.7           Lab Results  Component Value Date   WBC 4.1 11/11/2014   NEUTROABS 1.4 11/11/2014   HGB 9.6* 11/11/2014   HCT 29.8* 11/11/2014   MCV 83.0 11/11/2014   PLT 193 11/11/2014      Chemistry         Lab Results  Component Value Date   LABCA2 20.7 10/14/2014      ASSESSMENT and PlAN:  Stage 4  carcinoma of breast.  PET scan has been reviewed shows stable disease markers are slightly high.  Patient had severe   reaction to carboplatinum. \ Continue eribulin   Anemia multifactorial Rheumatoid arthritis Tumor marker shows rising trend will continue chemotherapy and if it if tumor marker continues to go up his CT scan or PET scan would be done for restaging.  Forest Gleason, MD   11/11/2014 10:23 AM

## 2014-11-12 ENCOUNTER — Encounter: Payer: Self-pay | Admitting: Oncology

## 2014-11-12 LAB — CANCER ANTIGEN 27.29: CA 27.29: 30.8 U/mL (ref 0.0–38.6)

## 2014-11-25 ENCOUNTER — Inpatient Hospital Stay: Payer: Medicare Other | Attending: Family Medicine

## 2014-11-25 ENCOUNTER — Inpatient Hospital Stay (HOSPITAL_BASED_OUTPATIENT_CLINIC_OR_DEPARTMENT_OTHER): Payer: Medicare Other | Admitting: Family Medicine

## 2014-11-25 ENCOUNTER — Inpatient Hospital Stay: Payer: Medicare Other

## 2014-11-25 VITALS — BP 130/82 | HR 94 | Temp 97.0°F | Resp 18 | Wt 175.0 lb

## 2014-11-25 DIAGNOSIS — I10 Essential (primary) hypertension: Secondary | ICD-10-CM | POA: Diagnosis not present

## 2014-11-25 DIAGNOSIS — C7800 Secondary malignant neoplasm of unspecified lung: Secondary | ICD-10-CM

## 2014-11-25 DIAGNOSIS — C787 Secondary malignant neoplasm of liver and intrahepatic bile duct: Secondary | ICD-10-CM | POA: Insufficient documentation

## 2014-11-25 DIAGNOSIS — Z171 Estrogen receptor negative status [ER-]: Secondary | ICD-10-CM | POA: Diagnosis not present

## 2014-11-25 DIAGNOSIS — C171 Malignant neoplasm of jejunum: Secondary | ICD-10-CM | POA: Diagnosis not present

## 2014-11-25 DIAGNOSIS — D709 Neutropenia, unspecified: Secondary | ICD-10-CM | POA: Diagnosis not present

## 2014-11-25 DIAGNOSIS — Z800 Family history of malignant neoplasm of digestive organs: Secondary | ICD-10-CM | POA: Insufficient documentation

## 2014-11-25 DIAGNOSIS — C50912 Malignant neoplasm of unspecified site of left female breast: Secondary | ICD-10-CM | POA: Insufficient documentation

## 2014-11-25 DIAGNOSIS — M069 Rheumatoid arthritis, unspecified: Secondary | ICD-10-CM | POA: Insufficient documentation

## 2014-11-25 DIAGNOSIS — D649 Anemia, unspecified: Secondary | ICD-10-CM | POA: Insufficient documentation

## 2014-11-25 DIAGNOSIS — Z5111 Encounter for antineoplastic chemotherapy: Secondary | ICD-10-CM | POA: Diagnosis present

## 2014-11-25 LAB — CBC WITH DIFFERENTIAL/PLATELET
BASOS PCT: 1 %
Basophils Absolute: 0.1 10*3/uL (ref 0–0.1)
EOS PCT: 0 %
Eosinophils Absolute: 0 10*3/uL (ref 0–0.7)
HEMATOCRIT: 31 % — AB (ref 35.0–47.0)
Hemoglobin: 10 g/dL — ABNORMAL LOW (ref 12.0–16.0)
LYMPHS PCT: 46 %
Lymphs Abs: 1.9 10*3/uL (ref 1.0–3.6)
MCH: 26.3 pg (ref 26.0–34.0)
MCHC: 32.3 g/dL (ref 32.0–36.0)
MCV: 81.4 fL (ref 80.0–100.0)
MONO ABS: 0.8 10*3/uL (ref 0.2–0.9)
MONOS PCT: 19 %
NEUTROS ABS: 1.4 10*3/uL (ref 1.4–6.5)
Neutrophils Relative %: 34 %
Platelets: 204 10*3/uL (ref 150–440)
RBC: 3.81 MIL/uL (ref 3.80–5.20)
RDW: 19.7 % — AB (ref 11.5–14.5)
WBC: 4.2 10*3/uL (ref 3.6–11.0)

## 2014-11-25 LAB — COMPREHENSIVE METABOLIC PANEL
ALT: 21 U/L (ref 14–54)
ANION GAP: 6 (ref 5–15)
AST: 43 U/L — ABNORMAL HIGH (ref 15–41)
Albumin: 3.4 g/dL — ABNORMAL LOW (ref 3.5–5.0)
Alkaline Phosphatase: 109 U/L (ref 38–126)
BILIRUBIN TOTAL: 0.6 mg/dL (ref 0.3–1.2)
BUN: 14 mg/dL (ref 6–20)
CO2: 23 mmol/L (ref 22–32)
Calcium: 8.4 mg/dL — ABNORMAL LOW (ref 8.9–10.3)
Chloride: 103 mmol/L (ref 101–111)
Creatinine, Ser: 0.89 mg/dL (ref 0.44–1.00)
GFR calc Af Amer: >60 mL/min (ref >60)
Glucose, Bld: 132 mg/dL — ABNORMAL HIGH (ref 65–99)
POTASSIUM: 4 mmol/L (ref 3.5–5.1)
Sodium: 132 mmol/L — ABNORMAL LOW (ref 135–145)
TOTAL PROTEIN: 7.7 g/dL (ref 6.5–8.1)

## 2014-11-25 MED ORDER — SODIUM CHLORIDE 0.9 % IV SOLN
1.1000 mg/m2 | Freq: Once | INTRAVENOUS | Status: AC
  Administered 2014-11-25: 2 mg via INTRAVENOUS
  Filled 2014-11-25: qty 4

## 2014-11-25 MED ORDER — HEPARIN SOD (PORK) LOCK FLUSH 100 UNIT/ML IV SOLN
500.0000 [IU] | Freq: Once | INTRAVENOUS | Status: AC
  Administered 2014-11-25: 500 [IU] via INTRAVENOUS
  Filled 2014-11-25: qty 5

## 2014-11-25 MED ORDER — SODIUM CHLORIDE 0.9 % IV SOLN
Freq: Once | INTRAVENOUS | Status: AC
  Administered 2014-11-25: 11:00:00 via INTRAVENOUS
  Filled 2014-11-25: qty 4

## 2014-11-25 MED ORDER — SODIUM CHLORIDE 0.9 % IV SOLN
Freq: Once | INTRAVENOUS | Status: AC
  Administered 2014-11-25: 11:00:00 via INTRAVENOUS
  Filled 2014-11-25: qty 1000

## 2014-11-25 MED ORDER — SODIUM CHLORIDE 0.9 % IJ SOLN
10.0000 mL | INTRAMUSCULAR | Status: DC | PRN
  Administered 2014-11-25: 10 mL via INTRAVENOUS
  Filled 2014-11-25: qty 10

## 2014-11-25 NOTE — Progress Notes (Signed)
[name]  [phone]  [phone]     Beth Flores DOB: [birth year]  MR#: [id number]  SAY#:[id number]  Patient Care Team: Jodi Marble, MD as PCP - General (Internal Medicine) Forest Gleason, MD (Unknown Physician Specialty) Christene Lye, MD (General Surgery) Julieanne Manson Leeanne Mannan., MD as Consulting Physician (Rheumatology)  CHIEF COMPLAINT:  Chief Complaint  Patient presents with  . no complaints    Patient is for eribulin for treatment of breast cancer.  INTERVAL HISTORY:   Patient is here for further evaluation and treatment consideration regarding carcinoma of breast. She is currently being treated with eribulin as of May 2016 due to poor tolerance of carboplatin and Taxol. She reports overall feeling great. She denies any complaints at this time.  REVIEW OF SYSTEMS:   Review of Systems  Constitutional: Negative for fever, chills, weight loss, malaise/fatigue and diaphoresis.  HENT: Negative for congestion, ear discharge, ear pain, hearing loss, nosebleeds, sore throat and tinnitus.   Eyes: Negative for blurred vision, double vision, photophobia, pain, discharge and redness.  Respiratory: Negative for cough, hemoptysis, sputum production, shortness of breath, wheezing and stridor.   Cardiovascular: Negative for chest pain, palpitations, orthopnea, claudication, leg swelling and PND.  Gastrointestinal: Negative for heartburn, nausea, vomiting, abdominal pain, diarrhea, constipation, blood in stool and melena.  Genitourinary: Negative.   Musculoskeletal: Negative.   Skin: Negative.   Neurological: Negative for dizziness, tingling, focal weakness, seizures, weakness and headaches.  Endo/Heme/Allergies: Does not bruise/bleed easily.  Psychiatric/Behavioral: Negative for depression. The patient is not nervous/anxious and does not have insomnia.     As per HPI. Otherwise, a complete review of systems is negatve.  ONCOLOGY  HISTORY: Oncology History   1. Carcinoma of breast (left) diagnosis on June 14, by a core needle biopsy and lymph node biopsy. Estrogen receptor negative, progesterone receptor negative, HER-2 receptor negative. 2. Status post lumpectomy (January, 2012), 0.9 cm tumor, one positive lymph node.  Margins may be involved. AJCC Staging: [id number]_0 Stage Grouping: status postchemotherapy and radiation treatment. 3.  MyRisk genetic mutation study is negative for any mutation. 4.  Patient also has had due to Rheumatoid arthritis on methotrexate tablet. 5.  PET scan shows progressive disease with multiple liver metastases and lung metastases (November, 18th, 2015)  6.  Patient was started on chemotherapy with carboplatinum /abraxene  day 1 day 8 schedule biopsy from jugular lymph node  was positive for metastatic breast cancer. 7.  Because of poor tolerance to chemotherapy with carboplatinum and Taxol patient was started on ERIBULIN from May of 2016         Cancer of left breast, stage 4 ([place])   06/25/2014 Initial Diagnosis Cancer of left breast, stage 4    PAST MEDICAL HISTORY: Past Medical History  Diagnosis Date  . Rheumatoid arthritis   . Cancer 2012    breast   . Breast cancer   . Hypertension   . Cancer of left breast, stage 4 06/25/2014    PAST SURGICAL HISTORY: Past Surgical History  Procedure Laterality Date  . Breast surgery  2010    lumpectomy  . Tubal ligation  1993  . Carpal tunnel release Bilateral   . Portacath placement  01/19/14    FAMILY HISTORY Family History  Problem Relation Age of Onset  . Cancer Father     colon    GYNECOLOGIC HISTORY:  No LMP recorded. Patient is not currently having periods (Reason: Chemotherapy).     ADVANCED DIRECTIVES:  HEALTH MAINTENANCE: Social History  Substance Use Topics  . Smoking status: Never Smoker   . Smokeless tobacco: Never Used  . Alcohol Use: No     Colonoscopy:  PAP:  Bone density:  Lipid  panel:  Allergies  Allergen Reactions  . Carboplatin Shortness Of Breath    Other reaction(s): Tight chest (finding)    Current Outpatient Prescriptions  Medication Sig Dispense Refill  . Ferrous Sulfate (SLOW FE) 142 (45 FE) MG TBCR Take 1 tablet by mouth daily.    . fluticasone (FLONASE) 50 MCG/ACT nasal spray 1 spray as needed for allergies.   0  . folic acid (FOLVITE) 1 MG tablet Take 1 mg by mouth daily.    . hydrochlorothiazide (MICROZIDE) 12.5 MG capsule Take 12.5 mg by mouth daily.    . hydroxychloroquine (PLAQUENIL) 200 MG tablet   0  . ibuprofen (ADVIL,MOTRIN) 800 MG tablet Take 800 mg by mouth every 8 (eight) hours as needed.    . lidocaine-prilocaine (EMLA) cream Apply 1 application topically as needed. 30 min prior to accessing port 30 g 3  . loratadine (CLARITIN) 10 MG tablet Take 10 mg by mouth daily.    . magnesium chloride (SLOW-MAG) 64 MG TBEC SR tablet Take 1 tablet (64 mg total) by mouth 2 (two) times daily. 60 tablet 3  . nystatin (MYCOSTATIN) 100000 UNIT/ML suspension take 5 milliliters by mouth four times a day 120 mL 0  . pantoprazole (PROTONIX) 40 MG tablet Take 40 mg by mouth daily.    . promethazine (PHENERGAN) 25 MG tablet Take 25 mg by mouth every 6 (six) hours as needed for nausea or vomiting.    . traMADol (ULTRAM) 50 MG tablet Take 50 mg by mouth every 6 (six) hours as needed for moderate pain.     No current facility-administered medications for this visit.   Facility-Administered Medications Ordered in Other Visits  Medication Dose Route Frequency Provider Last Rate Last Dose  . heparin lock flush 100 unit/mL  500 Units Intravenous Once Forest Gleason, MD      . ondansetron (ZOFRAN) 8 mg, dexamethasone (DECADRON) 10 mg in sodium chloride 0.9 % 50 mL IVPB   Intravenous Once Forest Gleason, MD      . sodium chloride 0.9 % injection 10 mL  10 mL Intracatheter PRN Forest Gleason, MD      . sodium chloride 0.9 % injection 10 mL  10 mL Intravenous PRN Forest Gleason, MD   10 mL at 11/25/14 0955    OBJECTIVE: BP 130/82 mmHg  Pulse 94  Temp(Src) 97 F (36.1 C) (Tympanic)  Resp 18  Wt 175 lb (79.379 kg)   Body mass index is 34.18 kg/(m^2).    ECOG FS:0 - Asymptomatic  General: Well-developed, well-nourished, no acute distress. Eyes: Pink conjunctiva, anicteric sclera. HEENT: Normocephalic, moist mucous membranes, clear oropharnyx. Lungs: Clear to auscultation bilaterally. Heart: Regular rate and rhythm. No rubs, murmurs, or gallops. Abdomen: Soft, nontender, nondistended. No organomegaly noted, normoactive bowel sounds. Musculoskeletal: No edema, cyanosis, or clubbing. Neuro: Alert, answering all questions appropriately. Cranial nerves grossly intact. Skin: No rashes or petechiae noted. Psych: Normal affect.   LAB RESULTS:  Infusion on 11/25/2014  Component Date Value Ref Range Status  . WBC 11/25/2014 4.2  3.6 - 11.0 K/uL Final  . RBC 11/25/2014 3.81  3.80 - 5.20 MIL/uL Final  . Hemoglobin 11/25/2014 10.0* 12.0 - 16.0 g/dL Final  . HCT 11/25/2014 31.0* 35.0 - 47.0 % Final  . MCV 11/25/2014  81.4  80.0 - 100.0 fL Final  . MCH 11/25/2014 26.3  26.0 - 34.0 pg Final  . MCHC 11/25/2014 32.3  32.0 - 36.0 g/dL Final  . RDW 11/25/2014 19.7* 11.5 - 14.5 % Final  . Platelets 11/25/2014 204  150 - 440 K/uL Final  . Neutrophils Relative % 11/25/2014 34   Final  . Neutro Abs 11/25/2014 1.4  1.4 - 6.5 K/uL Final  . Lymphocytes Relative 11/25/2014 46   Final  . Lymphs Abs 11/25/2014 1.9  1.0 - 3.6 K/uL Final  . Monocytes Relative 11/25/2014 19   Final  . Monocytes Absolute 11/25/2014 0.8  0.2 - 0.9 K/uL Final  . Eosinophils Relative 11/25/2014 0   Final  . Eosinophils Absolute 11/25/2014 0.0  0 - 0.7 K/uL Final  . Basophils Relative 11/25/2014 1   Final  . Basophils Absolute 11/25/2014 0.1  0 - 0.1 K/uL Final  . Sodium 11/25/2014 132* 135 - 145 mmol/L Final  . Potassium 11/25/2014 4.0  3.5 - 5.1 mmol/L Final  . Chloride 11/25/2014 103  101 -  111 mmol/L Final  . CO2 11/25/2014 23  22 - 32 mmol/L Final  . Glucose, Bld 11/25/2014 132* 65 - 99 mg/dL Final  . BUN 11/25/2014 14  6 - 20 mg/dL Final  . Creatinine, Ser 11/25/2014 0.89  0.44 - 1.00 mg/dL Final  . Calcium 11/25/2014 8.4* 8.9 - 10.3 mg/dL Final  . Total Protein 11/25/2014 7.7  6.5 - 8.1 g/dL Final  . Albumin 11/25/2014 3.4* 3.5 - 5.0 g/dL Final  . AST 11/25/2014 43* 15 - 41 U/L Final  . ALT 11/25/2014 21  14 - 54 U/L Final  . Alkaline Phosphatase 11/25/2014 109  38 - 126 U/L Final  . Total Bilirubin 11/25/2014 0.6  0.3 - 1.2 mg/dL Final  . GFR calc non Af Amer 11/25/2014 >60  >60 mL/min Final  . GFR calc Af Amer 11/25/2014 >60  >60 mL/min Final   Comment: (NOTE) The eGFR has been calculated using the CKD EPI equation. This calculation has not been validated in all clinical situations. eGFR's persistently <60 mL/min signify possible Chronic Kidney Disease.   . Anion gap 11/25/2014 6  5 - 15 Final    STUDIES: No results found.  ASSESSMENT:  Carcinoma of breast , stage IV.  PLAN:  1.  Carcinoma of breast. Patient is currently on eribulin therapy and tolerating very well. Lab data has been reviewed,  We'll proceed with cycle 6 day 15. Patient receives eribulin on an every two-week schedule.  She will return in 2 weeks for her next cycle.  Patient expressed understanding and was in agreement with this plan. She also understands that She can call clinic at any time with any questions, concerns, or complaints.   Dr. Oliva Bustard was available for consultation and review of plan of care for this patient.  No matching staging information was found for the patient.  Evlyn Kanner, NP   11/25/2014 10:58 AM

## 2014-12-06 ENCOUNTER — Other Ambulatory Visit: Payer: Self-pay | Admitting: *Deleted

## 2014-12-06 DIAGNOSIS — C50912 Malignant neoplasm of unspecified site of left female breast: Secondary | ICD-10-CM

## 2014-12-09 ENCOUNTER — Inpatient Hospital Stay (HOSPITAL_BASED_OUTPATIENT_CLINIC_OR_DEPARTMENT_OTHER): Payer: Medicare Other | Admitting: Oncology

## 2014-12-09 ENCOUNTER — Inpatient Hospital Stay: Payer: Medicare Other

## 2014-12-09 ENCOUNTER — Ambulatory Visit
Admission: RE | Admit: 2014-12-09 | Discharge: 2014-12-09 | Disposition: A | Discharge status: Home / Self Care | Payer: Medicare Other | Source: Ambulatory Visit | Attending: Oncology | Admitting: Oncology

## 2014-12-09 VITALS — BP 139/90 | HR 80 | Temp 95.9°F | Wt 173.9 lb

## 2014-12-09 DIAGNOSIS — C50912 Malignant neoplasm of unspecified site of left female breast: Secondary | ICD-10-CM

## 2014-12-09 DIAGNOSIS — D709 Neutropenia, unspecified: Secondary | ICD-10-CM

## 2014-12-09 DIAGNOSIS — M069 Rheumatoid arthritis, unspecified: Secondary | ICD-10-CM

## 2014-12-09 DIAGNOSIS — C787 Secondary malignant neoplasm of liver and intrahepatic bile duct: Secondary | ICD-10-CM

## 2014-12-09 DIAGNOSIS — I10 Essential (primary) hypertension: Secondary | ICD-10-CM

## 2014-12-09 DIAGNOSIS — Z171 Estrogen receptor negative status [ER-]: Secondary | ICD-10-CM | POA: Diagnosis not present

## 2014-12-09 DIAGNOSIS — Z800 Family history of malignant neoplasm of digestive organs: Secondary | ICD-10-CM

## 2014-12-09 DIAGNOSIS — Z5111 Encounter for antineoplastic chemotherapy: Secondary | ICD-10-CM | POA: Diagnosis not present

## 2014-12-09 DIAGNOSIS — C7800 Secondary malignant neoplasm of unspecified lung: Secondary | ICD-10-CM | POA: Diagnosis not present

## 2014-12-09 DIAGNOSIS — R918 Other nonspecific abnormal finding of lung field: Secondary | ICD-10-CM | POA: Insufficient documentation

## 2014-12-09 DIAGNOSIS — D649 Anemia, unspecified: Secondary | ICD-10-CM

## 2014-12-09 LAB — COMPREHENSIVE METABOLIC PANEL
ALBUMIN: 3.4 g/dL — AB (ref 3.5–5.0)
ALT: 23 U/L (ref 14–54)
AST: 40 U/L (ref 15–41)
Alkaline Phosphatase: 128 U/L — ABNORMAL HIGH (ref 38–126)
Anion gap: 5 (ref 5–15)
BUN: 17 mg/dL (ref 6–20)
CHLORIDE: 102 mmol/L (ref 101–111)
CO2: 25 mmol/L (ref 22–32)
CREATININE: 1.02 mg/dL — AB (ref 0.44–1.00)
Calcium: 8 mg/dL — ABNORMAL LOW (ref 8.9–10.3)
GFR calc Af Amer: >60 mL/min (ref >60)
GLUCOSE: 116 mg/dL — AB (ref 65–99)
POTASSIUM: 4 mmol/L (ref 3.5–5.1)
Sodium: 132 mmol/L — ABNORMAL LOW (ref 135–145)
Total Bilirubin: 0.5 mg/dL (ref 0.3–1.2)
Total Protein: 7.5 g/dL (ref 6.5–8.1)

## 2014-12-09 MED ORDER — IOHEXOL 350 MG/ML SOLN
75.0000 mL | Freq: Once | INTRAVENOUS | Status: AC | PRN
  Administered 2014-12-09: 75 mL via INTRAVENOUS

## 2014-12-09 MED ORDER — HEPARIN SOD (PORK) LOCK FLUSH 100 UNIT/ML IV SOLN
500.0000 [IU] | Freq: Once | INTRAVENOUS | Status: DC
  Filled 2014-12-09: qty 5

## 2014-12-09 MED ORDER — SODIUM CHLORIDE 0.9 % IV SOLN
Freq: Once | INTRAVENOUS | Status: AC
  Administered 2014-12-09: 11:00:00 via INTRAVENOUS
  Filled 2014-12-09: qty 1000

## 2014-12-09 MED ORDER — SODIUM CHLORIDE 0.9 % IJ SOLN
10.0000 mL | INTRAMUSCULAR | Status: DC | PRN
  Administered 2014-12-09: 10 mL via INTRAVENOUS
  Filled 2014-12-09: qty 10

## 2014-12-09 MED ORDER — ERIBULIN MESYLATE CHEMO INJECTION 1 MG/2ML
1.1000 mg/m2 | Freq: Once | INTRAVENOUS | Status: AC
  Administered 2014-12-09: 2 mg via INTRAVENOUS
  Filled 2014-12-09: qty 4

## 2014-12-09 MED ORDER — SODIUM CHLORIDE 0.9 % IV SOLN
Freq: Once | INTRAVENOUS | Status: AC
  Administered 2014-12-09: 11:00:00 via INTRAVENOUS
  Filled 2014-12-09: qty 4

## 2014-12-09 NOTE — Progress Notes (Signed)
Patient states she is having discomfort in her right shoulder when she inhales.  Also c/o headache.

## 2014-12-09 NOTE — Progress Notes (Signed)
[place] @ [facility] Telephone:(336) [phone]  Fax:(336) [phone]     Beth Flores OB: 08-29-1960  MR#: [id number]  [id number]#:[id number]  Patient Care Team: Jodi Marble, MD as PCP - General (Internal Medicine) Forest Gleason, MD (Unknown Physician Specialty) Christene Lye, MD (General Surgery) Julieanne Manson Leeanne Mannan., MD as Consulting Physician (Rheumatology)  CHIEF COMPLAINT:  Chief Complaint  Patient presents with  . OTHER    Oncology History   1. Carcinoma of breast (left) diagnosis on June 14, by a core needle biopsy and lymph node biopsy. Estrogen receptor negative, progesterone receptor negative, HER-2 receptor negative. 2. Status post lumpectomy (January, 2012), 0.9 cm tumor, one positive lymph node.  Margins may be involved. AJCC Staging: pT1b_N1_M_0 Stage Grouping: status postchemotherapy and radiation treatment. 3.  MyRisk genetic mutation study is negative for any mutation. 4.  Patient also has had due to Rheumatoid arthritis on methotrexate tablet. 5.  PET scan shows progressive disease with multiple liver metastases and lung metastases (November, 18th, 2015)  6.  Patient was started on chemotherapy with carboplatinum /abraxene  day 1 day 8 schedule biopsy from jugular lymph node  was positive for metastatic breast cancer. 7.  Because of poor tolerance to chemotherapy with carboplatinum and Taxol patient was started on ERIBULIN from May of 2016           Oncology Flowsheet 09/02/2014 09/16/2014 09/30/2014 10/14/2014 10/28/2014 11/11/2014 11/25/2014  Day, Cycle Day 8, Cycle 3 Day 1, Cycle 4 Day 8, Cycle 4 Day 1, 6 Day 8, 6 Day 1, 6 Day 8, 6  dexamethasone (DECADRON) IV [ 10 mg ] [ 10 mg ] [ 10 mg ] [ 10 mg ] [ 10 mg ] [ 10 mg ] [ 10 mg ]  eriBULin mesylate (HALAVEN) IV 1.1 mg/m2 1.1 mg/m2 1.1 mg/m2 1.1 mg/m2 1.1 mg/m2 1.1 mg/m2 1.1 mg/m2  ondansetron (ZOFRAN) IV [ 8 mg ] [ 8 mg ] [ 8 mg ] [ 8 mg ] [ 8 mg ] [ 8 mg ] [ 8 mg ]    INTERVAL HISTORY  54 year old  African-American lady with a stage IV carcinoma of breast.  Triple negative disease. Patient had developed sore throat was given antibiotic with Zithromax it improved his pain on the right side of the face as well as swelling which is gradually been resolving.  Has some discomfort in the right upper chest wall area particularly when patient takes deep breath. Overall also has noticed some more discharge from the left breast Patient is here for ongoing evaluation and treatment consideration Patient is rising tumor marker  .  REVIEW OF SYSTEMS:   ROS  general status: Patient is feeling somewhat stronger.Marland Kitchen  No change in a performance status.  No chills.  No fever. HEENT: Alopecia.  No evidence of stomatitis Lungs: No cough or shortness of breath Cardiac: No chest pain or paroxysmal nocturnal dyspnea GI: No nausea no vomiting no diarrhea no abdominal pain Skin: No rash Lower extremity no swelling Neurological system: No tingling.  No numbness.  No other focal signs Musculoskeletal system no bony pains  As per HPI. Otherwise, a complete review of systems is negatve.  PAST MEDICAL HISTORY: Past Medical History  Diagnosis Date  . Rheumatoid arthritis   . Cancer 2012    breast   . Breast cancer   . Hypertension   . Cancer of left breast, stage 4 06/25/2014    PAST SURGICAL HISTORY: Past Surgical History  Procedure Laterality Date  .  Breast surgery  2010    lumpectomy  . Tubal ligation  1993  . Carpal tunnel release Bilateral   . Portacath placement  01/19/14    FAMILY HISTORY Family History  Problem Relation Age of Onset  . Cancer Father     colon        ADVANCED DIRECTIVES:  patient was advised and discuss her living will.  All available resources were offered   HEALTH MAINTENANCE: Social History  Substance Use Topics  . Smoking status: Never Smoker   . Smokeless tobacco: Never Used  . Alcohol Use: No      Allergies  Allergen Reactions  . Carboplatin  Shortness Of Breath    Other reaction(s): Tight chest (finding)    Current Outpatient Prescriptions  Medication Sig Dispense Refill  . Ferrous Sulfate (SLOW FE) 142 (45 FE) MG TBCR Take 1 tablet by mouth daily.    . fluticasone (FLONASE) 50 MCG/ACT nasal spray 1 spray as needed for allergies.   0  . folic acid (FOLVITE) 1 MG tablet Take 1 mg by mouth daily.    . hydrochlorothiazide (MICROZIDE) 12.5 MG capsule Take 12.5 mg by mouth daily.    . hydroxychloroquine (PLAQUENIL) 200 MG tablet   0  . ibuprofen (ADVIL,MOTRIN) 800 MG tablet Take 800 mg by mouth every 8 (eight) hours as needed.    . lidocaine-prilocaine (EMLA) cream Apply 1 application topically as needed. 30 min prior to accessing port 30 g 3  . loratadine (CLARITIN) 10 MG tablet Take 10 mg by mouth daily.    . magnesium chloride (SLOW-MAG) 64 MG TBEC SR tablet Take 1 tablet (64 mg total) by mouth 2 (two) times daily. 60 tablet 3  . nystatin (MYCOSTATIN) 100000 UNIT/ML suspension take 5 milliliters by mouth four times a day 120 mL 0  . pantoprazole (PROTONIX) 40 MG tablet Take 40 mg by mouth daily.    . promethazine (PHENERGAN) 25 MG tablet Take 25 mg by mouth every 6 (six) hours as needed for nausea or vomiting.    . traMADol (ULTRAM) 50 MG tablet Take 50 mg by mouth every 6 (six) hours as needed for moderate pain.     No current facility-administered medications for this visit.   Facility-Administered Medications Ordered in Other Visits  Medication Dose Route Frequency Provider Last Rate Last Dose  . heparin lock flush 100 unit/mL  500 Units Intravenous Once Forest Gleason, MD      . ondansetron (ZOFRAN) 8 mg, dexamethasone (DECADRON) 10 mg in sodium chloride 0.9 % 50 mL IVPB   Intravenous Once Forest Gleason, MD      . sodium chloride 0.9 % injection 10 mL  10 mL Intracatheter PRN Forest Gleason, MD      . sodium chloride 0.9 % injection 10 mL  10 mL Intravenous PRN Forest Gleason, MD        OBJECTIVE: Filed Vitals:   12/09/14 1018   BP: 139/90  Pulse: 80  Temp: 95.9 F (35.5 C)     Body mass index is 33.97 kg/(m^2).    ECOG FS:1 - Symptomatic but completely ambulatory  Physical Exam general status: Performance status is good.  Patient has not lost significant weight HEENT: No evidence of stomatitis. Sclera and conjunctivae :: No jaundice.   pale looking.alopecia Lungs: Air  entry equal on both sides.  No rhonchi.  No rales.  Cardiac: Heart sounds are normal.  No pericardial rub.  No murmur. Lymphatic system: Cervical, axillary, inguinal, lymph nodes not  palpable GI: Abdomen is soft.  No ascites.  Liver spleen not palpable.  No tenderness.  Bowel sounds are within normal limit Lower extremity: No edema Neurological system: Higher functions, cranial nerves intact no evidence of peripheral neuropathy. Skin: No rash.  No ecchymosis.. Examination of left breast shows nodular growth.  Right breast free of masses Left breast there is more drainage from the nodular growth. Left shoulder note swelling.  Tenderness.  LAB RESULTS: \    Ref Range 2wk ago    CA 27.29 0.0 - 38.6 U/mL 20.7        Component     Latest Ref Rng 07/29/2014 08/19/2014 09/16/2014 10/14/2014 11/11/2014  CA 27.29     0.0 - 38.6 U/mL 70.8 (H) 30.7 17.3 20.7 30.8   Component     Latest Ref Rng 12/09/2014  CA 27.29     0.0 - 38.6 U/mL 42.0 (H)      Lab Results  Component Value Date   WBC 3.9 12/09/2014   NEUTROABS 1.1* 12/09/2014   HGB 9.4* 12/09/2014   HCT 28.8* 12/09/2014   MCV 80.6 12/09/2014   PLT 186 12/09/2014      Chemistry         Lab Results  Component Value Date   LABCA2 30.8 11/11/2014      ASSESSMENT and PlAN: Stage IV carcinoma breast Tumor markers shows progressive disease CT scan of the chest was done which has been reviewed independently there was no evidence of pulmonary embolism.  There was suspected thrombus associated with port but there was no evidence at present time. CT scan has been reviewed with the  patient Will continue chemotherapy but he marker clips and rising then PET scan can be done  Neutropenia with 1.1 neutrophil count will be observed carefully monitored   Anemia multifactorial Rheumatoid arthritis Tumor marker shows rising trend will continue chemotherapy and if it if tumor marker continues to go up his CT scan or PET scan would be done for restaging.  Forest Gleason, MD   12/09/2014 10:34 AM

## 2014-12-10 ENCOUNTER — Encounter: Payer: Self-pay | Admitting: Oncology

## 2014-12-10 LAB — CBC WITH DIFFERENTIAL/PLATELET
BASOS ABS: 0 10*3/uL (ref 0–0.1)
BASOS PCT: 0 %
EOS PCT: 0 %
Eosinophils Absolute: 0 10*3/uL (ref 0–0.7)
HEMATOCRIT: 28.8 % — AB (ref 35.0–47.0)
Hemoglobin: 9.4 g/dL — ABNORMAL LOW (ref 12.0–16.0)
LYMPHS PCT: 48 %
Lymphs Abs: 1.8 10*3/uL (ref 1.0–3.6)
MCH: 26.3 pg (ref 26.0–34.0)
MCHC: 32.6 g/dL (ref 32.0–36.0)
MCV: 80.6 fL (ref 80.0–100.0)
MONO ABS: 0.9 10*3/uL (ref 0.2–0.9)
Monocytes Relative: 23 %
NEUTROS ABS: 1.1 10*3/uL — AB (ref 1.4–6.5)
Neutrophils Relative %: 29 %
PLATELETS: 186 10*3/uL (ref 150–440)
RBC: 3.58 MIL/uL — AB (ref 3.80–5.20)
RDW: 20.6 % — AB (ref 11.5–14.5)
WBC: 3.9 10*3/uL — AB (ref 4.0–10.5)

## 2014-12-10 LAB — CANCER ANTIGEN 27.29: CA 27.29: 42 U/mL — ABNORMAL HIGH (ref 0.0–38.6)

## 2014-12-30 ENCOUNTER — Inpatient Hospital Stay: Payer: Medicare Other | Attending: Oncology

## 2014-12-30 ENCOUNTER — Inpatient Hospital Stay: Payer: Medicare Other

## 2014-12-30 ENCOUNTER — Inpatient Hospital Stay (HOSPITAL_BASED_OUTPATIENT_CLINIC_OR_DEPARTMENT_OTHER): Payer: Medicare Other | Admitting: Oncology

## 2014-12-30 VITALS — BP 126/90 | HR 82 | Temp 97.0°F | Wt 172.4 lb

## 2014-12-30 DIAGNOSIS — C787 Secondary malignant neoplasm of liver and intrahepatic bile duct: Secondary | ICD-10-CM | POA: Diagnosis not present

## 2014-12-30 DIAGNOSIS — C7800 Secondary malignant neoplasm of unspecified lung: Secondary | ICD-10-CM | POA: Diagnosis not present

## 2014-12-30 DIAGNOSIS — C779 Secondary and unspecified malignant neoplasm of lymph node, unspecified: Secondary | ICD-10-CM | POA: Insufficient documentation

## 2014-12-30 DIAGNOSIS — Z5111 Encounter for antineoplastic chemotherapy: Secondary | ICD-10-CM | POA: Insufficient documentation

## 2014-12-30 DIAGNOSIS — C50912 Malignant neoplasm of unspecified site of left female breast: Secondary | ICD-10-CM

## 2014-12-30 DIAGNOSIS — M069 Rheumatoid arthritis, unspecified: Secondary | ICD-10-CM | POA: Insufficient documentation

## 2014-12-30 DIAGNOSIS — Z171 Estrogen receptor negative status [ER-]: Secondary | ICD-10-CM

## 2014-12-30 DIAGNOSIS — I10 Essential (primary) hypertension: Secondary | ICD-10-CM | POA: Diagnosis not present

## 2014-12-30 DIAGNOSIS — Z800 Family history of malignant neoplasm of digestive organs: Secondary | ICD-10-CM | POA: Insufficient documentation

## 2014-12-30 DIAGNOSIS — Z79899 Other long term (current) drug therapy: Secondary | ICD-10-CM | POA: Insufficient documentation

## 2014-12-30 DIAGNOSIS — M25511 Pain in right shoulder: Secondary | ICD-10-CM | POA: Insufficient documentation

## 2014-12-30 DIAGNOSIS — Z923 Personal history of irradiation: Secondary | ICD-10-CM | POA: Insufficient documentation

## 2014-12-30 DIAGNOSIS — Z9221 Personal history of antineoplastic chemotherapy: Secondary | ICD-10-CM | POA: Diagnosis not present

## 2014-12-30 LAB — COMPREHENSIVE METABOLIC PANEL
ALK PHOS: 162 U/L — AB (ref 38–126)
ALT: 25 U/L (ref 14–54)
AST: 43 U/L — ABNORMAL HIGH (ref 15–41)
Albumin: 3.3 g/dL — ABNORMAL LOW (ref 3.5–5.0)
Anion gap: 8 (ref 5–15)
BUN: 18 mg/dL (ref 6–20)
CALCIUM: 9 mg/dL (ref 8.9–10.3)
CO2: 22 mmol/L (ref 22–32)
CREATININE: 0.83 mg/dL (ref 0.44–1.00)
Chloride: 103 mmol/L (ref 101–111)
Glucose, Bld: 95 mg/dL (ref 65–99)
Potassium: 4 mmol/L (ref 3.5–5.1)
Sodium: 133 mmol/L — ABNORMAL LOW (ref 135–145)
Total Bilirubin: 0.4 mg/dL (ref 0.3–1.2)
Total Protein: 7.3 g/dL (ref 6.5–8.1)

## 2014-12-30 LAB — CBC WITH DIFFERENTIAL/PLATELET
BASOS PCT: 0 %
Basophils Absolute: 0 10*3/uL (ref 0–0.1)
EOS ABS: 0 10*3/uL (ref 0–0.7)
EOS PCT: 0 %
HCT: 29.5 % — ABNORMAL LOW (ref 35.0–47.0)
HEMOGLOBIN: 9.5 g/dL — AB (ref 12.0–16.0)
Lymphocytes Relative: 37 %
Lymphs Abs: 2 10*3/uL (ref 1.0–3.6)
MCH: 25.6 pg — ABNORMAL LOW (ref 26.0–34.0)
MCHC: 32.3 g/dL (ref 32.0–36.0)
MCV: 79.4 fL — ABNORMAL LOW (ref 80.0–100.0)
MONOS PCT: 10 %
Monocytes Absolute: 0.5 10*3/uL (ref 0.2–0.9)
NEUTROS PCT: 53 %
Neutro Abs: 2.8 10*3/uL (ref 1.4–6.5)
PLATELETS: 226 10*3/uL (ref 150–440)
RBC: 3.71 MIL/uL — ABNORMAL LOW (ref 3.80–5.20)
RDW: 21.9 % — ABNORMAL HIGH (ref 11.5–14.5)
WBC: 5.4 10*3/uL (ref 3.6–11.0)

## 2014-12-30 MED ORDER — ERIBULIN MESYLATE CHEMO INJECTION 1 MG/2ML
1.1000 mg/m2 | Freq: Once | INTRAVENOUS | Status: AC
  Administered 2014-12-30: 2 mg via INTRAVENOUS
  Filled 2014-12-30: qty 4

## 2014-12-30 MED ORDER — SODIUM CHLORIDE 0.9 % IV SOLN
Freq: Once | INTRAVENOUS | Status: AC
  Administered 2014-12-30: 11:00:00 via INTRAVENOUS
  Filled 2014-12-30: qty 1000

## 2014-12-30 MED ORDER — SODIUM CHLORIDE 0.9 % IJ SOLN
10.0000 mL | INTRAMUSCULAR | Status: AC | PRN
  Administered 2014-12-30: 10 mL via INTRAVENOUS
  Filled 2014-12-30: qty 10

## 2014-12-30 MED ORDER — SODIUM CHLORIDE 0.9 % IV SOLN
Freq: Once | INTRAVENOUS | Status: AC
  Administered 2014-12-30: 11:00:00 via INTRAVENOUS
  Filled 2014-12-30: qty 4

## 2014-12-30 MED ORDER — HEPARIN SOD (PORK) LOCK FLUSH 100 UNIT/ML IV SOLN
500.0000 [IU] | Freq: Once | INTRAVENOUS | Status: AC
  Administered 2014-12-30: 500 [IU] via INTRAVENOUS

## 2014-12-30 MED ORDER — HEPARIN SOD (PORK) LOCK FLUSH 100 UNIT/ML IV SOLN
500.0000 [IU] | Freq: Once | INTRAVENOUS | Status: AC
  Filled 2014-12-30: qty 5

## 2014-12-30 NOTE — Progress Notes (Signed)
[place] @ [facility] [phone]  [phone]     Beth Flores OB: December 31, 1960  MR#: [id number]  ACZ#:[id number]  Patient Care Team: Jodi Marble, MD as PCP - General (Internal Medicine) Forest Gleason, MD (Unknown Physician Specialty) Christene Lye, MD (General Surgery) Julieanne Manson Leeanne Mannan., MD as Consulting Physician (Rheumatology)  CHIEF COMPLAINT:  Chief Complaint  Patient presents with  . OTHER    Oncology History   1. Carcinoma of breast (left) diagnosis on June 14, by a core needle biopsy and lymph node biopsy. Estrogen receptor negative, progesterone receptor negative, HER-2 receptor negative. 2. Status post lumpectomy (January, 2012), 0.9 cm tumor, one positive lymph node.  Margins may be involved. AJCC Staging: [id number]_M_0 Stage Grouping: status postchemotherapy and radiation treatment. 3.  MyRisk genetic mutation study is negative for any mutation. 4.  Patient also has had due to Rheumatoid arthritis on methotrexate tablet. 5.  PET scan shows progressive disease with multiple liver metastases and lung metastases (November, 18th, 2015)  6.  Patient was started on chemotherapy with carboplatinum /abraxene  day 1 day 8 schedule biopsy from jugular lymph node  was positive for metastatic breast cancer. 7.  Because of poor tolerance to chemotherapy with carboplatinum and Taxol patient was started on ERIBULIN from May of 2016           Oncology Flowsheet 09/16/2014 09/30/2014 10/14/2014 10/28/2014 11/11/2014 11/25/2014 12/09/2014  Day, Cycle Day 1, Cycle 4 Day 8, Cycle 4 Day 1, 6 Day 8, 6 Day 1, 6 Day 8, 6 Day 1, 6  dexamethasone (DECADRON) IV [ 10 mg ] [ 10 mg ] [ 10 mg ] [ 10 mg ] [ 10 mg ] [ 10 mg ] [ 10 mg ]  eriBULin mesylate (HALAVEN) IV 1.1 mg/m2 1.1 mg/m2 1.1 mg/m2 1.1 mg/m2 1.1 mg/m2 1.1 mg/m2 1.1 mg/m2  ondansetron (ZOFRAN) IV [ 8 mg ] [ 8 mg ] [ 8 mg ] [ 8 mg ] [ 8 mg ] [ 8 mg ] [ 8 mg ]    INTERVAL HISTORY  54 year old  African-American lady with a stage IV carcinoma of breast.  Triple negative disease. Right shoulder pain is improved.  CT scan did not reveal any evidence of thrombosis.  Swelling on the right side of the face is decreased.  No nausea.  No vomiting.  Appetite has been improving.  No chills or fever. He should not is here for ongoing evaluation and treatment consideration .  REVIEW OF SYSTEMS:   ROS  general status: Patient is feeling somewhat stronger.Marland Kitchen  No change in a performance status.  No chills.  No fever. HEENT: Alopecia.  No evidence of stomatitis Lungs: No cough or shortness of breath Cardiac: No chest pain or paroxysmal nocturnal dyspnea GI: No nausea no vomiting no diarrhea no abdominal pain Skin: No rash Lower extremity no swelling Neurological system: No tingling.  No numbness.  No other focal signs Musculoskeletal system no bony pains  As per HPI. Otherwise, a complete review of systems is negatve.  PAST MEDICAL HISTORY: Past Medical History  Diagnosis Date  . Rheumatoid arthritis ([place])   . Cancer [facility]) 2012    breast   . Breast cancer ([place])   . Hypertension   . Cancer of left breast, stage 4 ([place]) 06/25/2014    PAST SURGICAL HISTORY: Past Surgical History  Procedure Laterality Date  . Breast surgery  2010    lumpectomy  . Tubal ligation  1993  . Carpal  tunnel release Bilateral   . Portacath placement  01/19/14    FAMILY HISTORY Family History  Problem Relation Age of Onset  . Cancer Father     colon        ADVANCED DIRECTIVES:  patient was advised and discuss her living will.  All available resources were offered   HEALTH MAINTENANCE: Social History  Substance Use Topics  . Smoking status: Never Smoker   . Smokeless tobacco: Never Used  . Alcohol Use: No      Allergies  Allergen Reactions  . Carboplatin Shortness Of Breath    Other reaction(s): Tight chest (finding)    Current Outpatient Prescriptions  Medication Sig Dispense Refill  .  Ferrous Sulfate (SLOW FE) 142 (45 FE) MG TBCR Take 1 tablet by mouth daily.    . fluticasone (FLONASE) 50 MCG/ACT nasal spray 1 spray as needed for allergies.   0  . folic acid (FOLVITE) 1 MG tablet Take 1 mg by mouth daily.    . hydrochlorothiazide (MICROZIDE) 12.5 MG capsule Take 12.5 mg by mouth daily.    . hydroxychloroquine (PLAQUENIL) 200 MG tablet   0  . ibuprofen (ADVIL,MOTRIN) 800 MG tablet Take 800 mg by mouth every 8 (eight) hours as needed.    . lidocaine-prilocaine (EMLA) cream Apply 1 application topically as needed. 30 min prior to accessing port 30 g 3  . loratadine (CLARITIN) 10 MG tablet Take 10 mg by mouth daily.    . magnesium chloride (SLOW-MAG) 64 MG TBEC SR tablet Take 1 tablet (64 mg total) by mouth 2 (two) times daily. 60 tablet 3  . nystatin (MYCOSTATIN) 100000 UNIT/ML suspension take 5 milliliters by mouth four times a day 120 mL 0  . pantoprazole (PROTONIX) 40 MG tablet Take 40 mg by mouth daily.    . promethazine (PHENERGAN) 25 MG tablet Take 25 mg by mouth every 6 (six) hours as needed for nausea or vomiting.    . traMADol (ULTRAM) 50 MG tablet Take 50 mg by mouth every 6 (six) hours as needed for moderate pain.     No current facility-administered medications for this visit.   Facility-Administered Medications Ordered in Other Visits  Medication Dose Route Frequency Provider Last Rate Last Dose  . heparin lock flush 100 unit/mL  500 Units Intravenous Once Forest Gleason, MD      . ondansetron (ZOFRAN) 8 mg, dexamethasone (DECADRON) 10 mg in sodium chloride 0.9 % 50 mL IVPB   Intravenous Once Forest Gleason, MD      . sodium chloride 0.9 % injection 10 mL  10 mL Intracatheter PRN Forest Gleason, MD      . sodium chloride 0.9 % injection 10 mL  10 mL Intravenous PRN Forest Gleason, MD   10 mL at 12/30/14 0903    OBJECTIVE: Filed Vitals:   12/30/14 0932  BP: 126/90  Pulse: 82  Temp: 97 F (36.1 C)     Body mass index is 33.67 kg/(m^2).    ECOG FS:1 - Symptomatic but  completely ambulatory  Physical Exam general status: Performance status is good.  Patient has not lost significant weight HEENT: No evidence of stomatitis. Sclera and conjunctivae :: No jaundice.   pale looking.alopecia Lungs: Air  entry equal on both sides.  No rhonchi.  No rales.  Cardiac: Heart sounds are normal.  No pericardial rub.  No murmur. Lymphatic system: Cervical, axillary, inguinal, lymph nodes not palpable GI: Abdomen is soft.  No ascites.  Liver spleen not palpable.  No tenderness.  Bowel sounds are within normal limit Lower extremity: No edema Neurological system: Higher functions, cranial nerves intact no evidence of peripheral neuropathy. Skin: No rash.  No ecchymosis.. Examination of left breast shows nodular growth.  Right breast free of masses Left breast there is more drainage from the nodular growth. Left shoulder note swelling.  Tenderness.  LAB RESULTS: \    Ref Range 2wk ago    CA 27.29 0.0 - 38.6 U/mL 20.7        Component     Latest Ref Rng 07/29/2014 08/19/2014 09/16/2014 10/14/2014 11/11/2014  CA 27.29     0.0 - 38.6 U/mL 70.8 (H) 30.7 17.3 20.7 30.8   Component     Latest Ref Rng 12/09/2014  CA 27.29     0.0 - 38.6 U/mL 42.0 (H)      Lab Results  Component Value Date   WBC 5.4 12/30/2014   NEUTROABS 2.8 12/30/2014   HGB 9.5* 12/30/2014   HCT 29.5* 12/30/2014   MCV 79.4* 12/30/2014   PLT 226 12/30/2014      Chemistry         Lab Results  Component Value Date   LABCA2 42.0* 12/09/2014      ASSESSMENT and PlAN: Stage IV carcinoma breast Tumor markers shows progressive disease CT scan of the chest was done which has been reviewed independently there was no evidence of pulmonary embolism.  There was suspected thrombus associated with port but there was no evidence at present time. CT scan has been reviewed with the patient Will continue chemotherapy but he marker keeps on rising then PET scan can be done  Continue  chemotherapy  Anemia multifactorial Rheumatoid arthritis Tumor marker shows rising trend will continue chemotherapy and if it if tumor marker continues to go up his CT scan or PET scan would be done for restaging.  Forest Gleason, MD   12/30/2014 10:01 AM

## 2014-12-31 ENCOUNTER — Encounter: Payer: Self-pay | Admitting: Oncology

## 2015-01-20 ENCOUNTER — Encounter: Payer: Self-pay | Admitting: Oncology

## 2015-01-20 ENCOUNTER — Inpatient Hospital Stay: Payer: Medicare Other | Attending: Oncology

## 2015-01-20 ENCOUNTER — Inpatient Hospital Stay (HOSPITAL_BASED_OUTPATIENT_CLINIC_OR_DEPARTMENT_OTHER): Payer: Medicare Other | Admitting: Oncology

## 2015-01-20 ENCOUNTER — Inpatient Hospital Stay: Payer: Medicare Other

## 2015-01-20 VITALS — BP 129/85 | HR 79 | Temp 98.4°F | Wt 166.7 lb

## 2015-01-20 DIAGNOSIS — M069 Rheumatoid arthritis, unspecified: Secondary | ICD-10-CM | POA: Diagnosis not present

## 2015-01-20 DIAGNOSIS — R978 Other abnormal tumor markers: Secondary | ICD-10-CM | POA: Insufficient documentation

## 2015-01-20 DIAGNOSIS — C7800 Secondary malignant neoplasm of unspecified lung: Secondary | ICD-10-CM | POA: Insufficient documentation

## 2015-01-20 DIAGNOSIS — I998 Other disorder of circulatory system: Secondary | ICD-10-CM | POA: Insufficient documentation

## 2015-01-20 DIAGNOSIS — M899 Disorder of bone, unspecified: Secondary | ICD-10-CM | POA: Diagnosis not present

## 2015-01-20 DIAGNOSIS — Z5111 Encounter for antineoplastic chemotherapy: Secondary | ICD-10-CM | POA: Insufficient documentation

## 2015-01-20 DIAGNOSIS — C787 Secondary malignant neoplasm of liver and intrahepatic bile duct: Secondary | ICD-10-CM | POA: Insufficient documentation

## 2015-01-20 DIAGNOSIS — D649 Anemia, unspecified: Secondary | ICD-10-CM | POA: Diagnosis not present

## 2015-01-20 DIAGNOSIS — Z800 Family history of malignant neoplasm of digestive organs: Secondary | ICD-10-CM

## 2015-01-20 DIAGNOSIS — C50912 Malignant neoplasm of unspecified site of left female breast: Secondary | ICD-10-CM

## 2015-01-20 DIAGNOSIS — Z79899 Other long term (current) drug therapy: Secondary | ICD-10-CM | POA: Diagnosis not present

## 2015-01-20 DIAGNOSIS — R1011 Right upper quadrant pain: Secondary | ICD-10-CM | POA: Insufficient documentation

## 2015-01-20 DIAGNOSIS — Z171 Estrogen receptor negative status [ER-]: Secondary | ICD-10-CM | POA: Insufficient documentation

## 2015-01-20 DIAGNOSIS — I10 Essential (primary) hypertension: Secondary | ICD-10-CM | POA: Insufficient documentation

## 2015-01-20 LAB — COMPREHENSIVE METABOLIC PANEL
ALK PHOS: 206 U/L — AB (ref 38–126)
ALT: 31 U/L (ref 14–54)
AST: 52 U/L — AB (ref 15–41)
Albumin: 3.3 g/dL — ABNORMAL LOW (ref 3.5–5.0)
Anion gap: 8 (ref 5–15)
BILIRUBIN TOTAL: 0.5 mg/dL (ref 0.3–1.2)
BUN: 14 mg/dL (ref 6–20)
CHLORIDE: 100 mmol/L — AB (ref 101–111)
CO2: 24 mmol/L (ref 22–32)
CREATININE: 0.87 mg/dL (ref 0.44–1.00)
Calcium: 9 mg/dL (ref 8.9–10.3)
GFR calc Af Amer: >60 mL/min (ref >60)
Glucose, Bld: 103 mg/dL — ABNORMAL HIGH (ref 65–99)
Potassium: 3.7 mmol/L (ref 3.5–5.1)
Sodium: 132 mmol/L — ABNORMAL LOW (ref 135–145)
TOTAL PROTEIN: 7.9 g/dL (ref 6.5–8.1)

## 2015-01-20 LAB — CBC WITH DIFFERENTIAL/PLATELET
BASOS ABS: 0 10*3/uL (ref 0–0.1)
Basophils Relative: 0 %
Eosinophils Absolute: 0 10*3/uL (ref 0–0.7)
Eosinophils Relative: 0 %
HEMATOCRIT: 30.3 % — AB (ref 35.0–47.0)
HEMOGLOBIN: 9.5 g/dL — AB (ref 12.0–16.0)
LYMPHS PCT: 33 %
Lymphs Abs: 2 10*3/uL (ref 1.0–3.6)
MCH: 25 pg — ABNORMAL LOW (ref 26.0–34.0)
MCHC: 31.5 g/dL — ABNORMAL LOW (ref 32.0–36.0)
MCV: 79.5 fL — AB (ref 80.0–100.0)
Monocytes Absolute: 0.7 10*3/uL (ref 0.2–0.9)
Monocytes Relative: 11 %
NEUTROS ABS: 3.5 10*3/uL (ref 1.4–6.5)
Neutrophils Relative %: 56 %
Platelets: 232 10*3/uL (ref 150–440)
RBC: 3.81 MIL/uL (ref 3.80–5.20)
RDW: 22.5 % — ABNORMAL HIGH (ref 11.5–14.5)
WBC: 6.2 10*3/uL (ref 3.6–11.0)

## 2015-01-20 MED ORDER — SODIUM CHLORIDE 0.9 % IV SOLN
Freq: Once | INTRAVENOUS | Status: AC
  Administered 2015-01-20: 12:00:00 via INTRAVENOUS
  Filled 2015-01-20: qty 1000

## 2015-01-20 MED ORDER — ZOVIRAX 400 MG PO TABS: 400.0000 mg | ORAL_TABLET | Freq: Every day | ORAL | Status: DC

## 2015-01-20 MED ORDER — SODIUM CHLORIDE 0.9 % IJ SOLN
10.0000 mL | INTRAMUSCULAR | Status: DC | PRN
  Administered 2015-01-20: 10 mL via INTRAVENOUS
  Filled 2015-01-20: qty 10

## 2015-01-20 MED ORDER — DEXAMETHASONE SODIUM PHOSPHATE 100 MG/10ML IJ SOLN
Freq: Once | INTRAMUSCULAR | Status: AC
  Administered 2015-01-20: 12:00:00 via INTRAVENOUS
  Filled 2015-01-20: qty 4

## 2015-01-20 MED ORDER — HEPARIN SOD (PORK) LOCK FLUSH 100 UNIT/ML IV SOLN
500.0000 [IU] | Freq: Once | INTRAVENOUS | Status: AC
  Administered 2015-01-20: 500 [IU] via INTRAVENOUS
  Filled 2015-01-20: qty 5

## 2015-01-20 MED ORDER — ERIBULIN MESYLATE CHEMO INJECTION 1 MG/2ML
1.1000 mg/m2 | Freq: Once | INTRAVENOUS | Status: AC
  Administered 2015-01-20: 2 mg via INTRAVENOUS
  Filled 2015-01-20: qty 4

## 2015-01-21 ENCOUNTER — Encounter: Payer: Self-pay | Admitting: Oncology

## 2015-01-21 LAB — CANCER ANTIGEN 27.29: CA 27.29: 106.4 U/mL — AB (ref 0.0–38.6)

## 2015-01-21 NOTE — Progress Notes (Signed)
[place] @ [facility] [phone]) [phone]  Fax:(336) [phone]     Beth Flores OB: April 29, 1960  MR#: [id number]  IWP#:[id number]  Patient Care Team: Jodi Marble, MD as PCP - General (Internal Medicine) Forest Gleason, MD (Unknown Physician Specialty) Christene Lye, MD (General Surgery) Julieanne Manson Leeanne Mannan., MD as Consulting Physician (Rheumatology)  CHIEF COMPLAINT:  Chief Complaint  Patient presents with  . Breast Cancer  . Chemotherapy    Oncology History   1. Carcinoma of breast (left) diagnosis on June 14, by a core needle biopsy and lymph node biopsy. Estrogen receptor negative, progesterone receptor negative, HER-2 receptor negative. 2. Status post lumpectomy (January, 2012), 0.9 cm tumor, one positive lymph node.  Margins may be involved. AJCC Staging: pT1b_N1_M_0 Stage Grouping: status postchemotherapy and radiation treatment. 3.  MyRisk genetic mutation study is negative for any mutation. 4.  Patient also has had due to Rheumatoid arthritis on methotrexate tablet. 5.  PET scan shows progressive disease with multiple liver metastases and lung metastases (November, 18th, 2015)  6.  Patient was started on chemotherapy with carboplatinum /abraxene  day 1 day 8 schedule biopsy from jugular lymph node  was positive for metastatic breast cancer. 7.  Because of poor tolerance to chemotherapy with carboplatinum and Taxol patient was started on ERIBULIN from May of 2016           Oncology Flowsheet 10/14/2014 10/28/2014 11/11/2014 11/25/2014 12/09/2014 12/30/2014 01/20/2015  Day, Cycle Day 1, 6 Day 8, 6 Day 1, 6 Day 8, 6 Day 1, 6 Day 8, 6 Day 1, 6  dexamethasone (DECADRON) IV [ 10 mg ] [ 10 mg ] [ 10 mg ] [ 10 mg ] [ 10 mg ] [ 10 mg ] [ 10 mg ]  eriBULin mesylate (HALAVEN) IV 1.1 mg/m2 1.1 mg/m2 1.1 mg/m2 1.1 mg/m2 1.1 mg/m2 1.1 mg/m2 1.1 mg/m2  ondansetron (ZOFRAN) IV [ 8 mg ] [ 8 mg ] [ 8 mg ] [ 8 mg ] [ 8 mg ] [ 8 mg ] [ 8 mg ]    INTERVAL  HISTORY  54 year old African-American lady with a stage IV carcinoma of breast.  Triple negative disease. Patient's general status is improved.  Has noticed some blistering lesions in the left for headache.  Slight pain.  No chills fever appetite has been improving.  No nausea no vomiting no diarrhea He should not is here for ongoing evaluation and continuation of treatment. Marland Kitchen  REVIEW OF SYSTEMS:   ROS  general status: Patient is feeling somewhat stronger.Marland Kitchen  No change in a performance status.  No chills.  No fever. HEENT: Alopecia.  No evidence of stomatitis Lungs: No cough or shortness of breath Cardiac: No chest pain or paroxysmal nocturnal dyspnea GI: No nausea no vomiting no diarrhea no abdominal pain Skin: No rash Lower extremity no swelling Neurological system: No tingling.  No numbness.  No other focal signs Musculoskeletal system no bony pains  As per HPI. Otherwise, a complete review of systems is negatve.  PAST MEDICAL HISTORY: Past Medical History  Diagnosis Date  . Rheumatoid arthritis ([place])   . Cancer [facility]) 2012    breast   . Breast cancer ([place])   . Hypertension   . Cancer of left breast, stage 4 ([place]) 06/25/2014    PAST SURGICAL HISTORY: Past Surgical History  Procedure Laterality Date  . Breast surgery  2010    lumpectomy  . Tubal ligation  1993  . Carpal tunnel release Bilateral   .  Portacath placement  01/19/14    FAMILY HISTORY Family History  Problem Relation Age of Onset  . Cancer Father     colon        ADVANCED DIRECTIVES:  patient was advised and discuss her living will.  All available resources were offered   HEALTH MAINTENANCE: Social History  Substance Use Topics  . Smoking status: Never Smoker   . Smokeless tobacco: Never Used  . Alcohol Use: No      Allergies  Allergen Reactions  . Carboplatin Shortness Of Breath    Other reaction(s): Tight chest (finding)    Current Outpatient Prescriptions  Medication Sig Dispense  Refill  . Ferrous Sulfate (SLOW FE) 142 (45 FE) MG TBCR Take 1 tablet by mouth daily.    . fluticasone (FLONASE) 50 MCG/ACT nasal spray 1 spray as needed for allergies.   0  . folic acid (FOLVITE) 1 MG tablet Take 1 mg by mouth daily.    . hydrochlorothiazide (MICROZIDE) 12.5 MG capsule Take 12.5 mg by mouth daily.    . hydroxychloroquine (PLAQUENIL) 200 MG tablet   0  . ibuprofen (ADVIL,MOTRIN) 800 MG tablet Take 800 mg by mouth every 8 (eight) hours as needed.    . lidocaine-prilocaine (EMLA) cream Apply 1 application topically as needed. 30 min prior to accessing port 30 g 3  . loratadine (CLARITIN) 10 MG tablet Take 10 mg by mouth daily.    . magnesium chloride (SLOW-MAG) 64 MG TBEC SR tablet Take 1 tablet (64 mg total) by mouth 2 (two) times daily. 60 tablet 3  . nystatin (MYCOSTATIN) 100000 UNIT/ML suspension take 5 milliliters by mouth four times a day 120 mL 0  . pantoprazole (PROTONIX) 40 MG tablet Take 40 mg by mouth daily.    . promethazine (PHENERGAN) 25 MG tablet Take 25 mg by mouth every 6 (six) hours as needed for nausea or vomiting.    . traMADol (ULTRAM) 50 MG tablet Take 50 mg by mouth every 6 (six) hours as needed for moderate pain.    Marland Kitchen ZOVIRAX 400 MG tablet Take 1 tablet (400 mg total) by mouth 5 (five) times daily. Take for 7 days then stop 35 tablet 0   No current facility-administered medications for this visit.   Facility-Administered Medications Ordered in Other Visits  Medication Dose Route Frequency Provider Last Rate Last Dose  . heparin lock flush 100 unit/mL  500 Units Intravenous Once Forest Gleason, MD      . ondansetron (ZOFRAN) 8 mg, dexamethasone (DECADRON) 10 mg in sodium chloride 0.9 % 50 mL IVPB   Intravenous Once Forest Gleason, MD      . sodium chloride 0.9 % injection 10 mL  10 mL Intracatheter PRN Forest Gleason, MD      . sodium chloride 0.9 % injection 10 mL  10 mL Intravenous PRN Forest Gleason, MD   10 mL at 12/30/14 0903    OBJECTIVE: Filed Vitals:    01/20/15 1049  BP: 129/85  Pulse: 79  Temp: 98.4 F (36.9 C)     Body mass index is 32.55 kg/(m^2).    ECOG FS:1 - Symptomatic but completely ambulatory  Physical Exam general status: Performance status is good.  Patient has not lost significant weight HEENT: No evidence of stomatitis. Sclera and conjunctivae :: No jaundice.   pale looking.alopecia. There might be herpetic lesion in the left frontal lbone  Lungs: Air  entry equal on both sides.  No rhonchi.  No rales.  Cardiac: Heart  sounds are normal.  No pericardial rub.  No murmur. Lymphatic system: Cervical, axillary, inguinal, lymph nodes not palpable GI: Abdomen is soft.  No ascites.  Liver spleen not palpable.  No tenderness.  Bowel sounds are within normal limit Lower extremity: No edema Neurological system: Higher functions, cranial nerves intact no evidence of peripheral neuropathy. Skin: No rash.  No ecchymosis.. Examination of left breast shows nodular growth.  Right breast free of masses Left breast there is more drainage from the nodular growth. Left shoulder note swelling.  Tenderness.  LAB RESULTS: \    Ref Range 2wk ago    CA 27.29 0.0 - 38.6 U/mL 20.7        Component     Latest Ref Rng 07/29/2014 08/19/2014 09/16/2014 10/14/2014 11/11/2014  CA 27.29     0.0 - 38.6 U/mL 70.8 (H) 30.7 17.3 20.7 30.8   Component     Latest Ref Rng 12/09/2014  CA 27.29     0.0 - 38.6 U/mL 42.0 (H)      Lab Results  Component Value Date   WBC 6.2 01/20/2015   NEUTROABS 3.5 01/20/2015   HGB 9.5* 01/20/2015   HCT 30.3* 01/20/2015   MCV 79.5* 01/20/2015   PLT 232 01/20/2015      Chemistry         Lab Results  Component Value Date   LABCA2 106.4* 01/20/2015      ASSESSMENT and PlAN: Stage IV carcinoma breast Tumor markers shows progressive disease CT scan of the chest was done which has been reviewed independently there was no evidence of pulmonary embolism.  There was suspected thrombus associated with port  but there was no evidence at present time. CT scan has been reviewed with the patient Will continue chemotherapy but he marker keeps on rising then PET scan can be done  Continue chemotherapy  Anemia multifactorial Rheumatoid arthritis Tumor marker shows rising trend will continue chemotherapy and if it if tumor marker continues to go up his CT scan or PET scan would be done for restaging.  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  Zovirax has been prescribed.  Possibility of PET scan can be considered are to next appointment Forest Gleason, MD   01/21/2015 4:58 PM

## 2015-01-24 ENCOUNTER — Other Ambulatory Visit: Payer: Self-pay | Admitting: *Deleted

## 2015-01-24 DIAGNOSIS — C50912 Malignant neoplasm of unspecified site of left female breast: Secondary | ICD-10-CM

## 2015-02-03 ENCOUNTER — Ambulatory Visit
Admission: RE | Admit: 2015-02-03 | Discharge: 2015-02-03 | Disposition: A | Discharge status: Home / Self Care | Payer: Medicare Other | Source: Ambulatory Visit | Attending: Oncology | Admitting: Oncology

## 2015-02-03 DIAGNOSIS — C50912 Malignant neoplasm of unspecified site of left female breast: Secondary | ICD-10-CM | POA: Diagnosis not present

## 2015-02-03 DIAGNOSIS — R1011 Right upper quadrant pain: Secondary | ICD-10-CM | POA: Insufficient documentation

## 2015-02-03 DIAGNOSIS — C787 Secondary malignant neoplasm of liver and intrahepatic bile duct: Secondary | ICD-10-CM | POA: Insufficient documentation

## 2015-02-03 DIAGNOSIS — C7951 Secondary malignant neoplasm of bone: Secondary | ICD-10-CM | POA: Insufficient documentation

## 2015-02-03 MED ORDER — IOHEXOL 300 MG/ML  SOLN
100.0000 mL | Freq: Once | INTRAMUSCULAR | Status: AC | PRN
  Administered 2015-02-03: 100 mL via INTRAVENOUS

## 2015-02-05 ENCOUNTER — Other Ambulatory Visit: Payer: Self-pay

## 2015-02-05 ENCOUNTER — Emergency Department: Payer: Medicare Other

## 2015-02-05 ENCOUNTER — Emergency Department
Admission: EM | Admit: 2015-02-05 | Discharge: 2015-02-05 | Disposition: A | Discharge status: Home / Self Care | Payer: Medicare Other | Attending: Emergency Medicine | Admitting: Emergency Medicine

## 2015-02-05 DIAGNOSIS — R197 Diarrhea, unspecified: Secondary | ICD-10-CM | POA: Insufficient documentation

## 2015-02-05 DIAGNOSIS — Z792 Long term (current) use of antibiotics: Secondary | ICD-10-CM | POA: Diagnosis not present

## 2015-02-05 DIAGNOSIS — I10 Essential (primary) hypertension: Secondary | ICD-10-CM | POA: Diagnosis not present

## 2015-02-05 DIAGNOSIS — Z79899 Other long term (current) drug therapy: Secondary | ICD-10-CM | POA: Diagnosis not present

## 2015-02-05 DIAGNOSIS — C50919 Malignant neoplasm of unspecified site of unspecified female breast: Secondary | ICD-10-CM | POA: Diagnosis not present

## 2015-02-05 DIAGNOSIS — R1010 Upper abdominal pain, unspecified: Secondary | ICD-10-CM

## 2015-02-05 DIAGNOSIS — R079 Chest pain, unspecified: Secondary | ICD-10-CM | POA: Diagnosis not present

## 2015-02-05 DIAGNOSIS — R000 Tachycardia, unspecified: Secondary | ICD-10-CM | POA: Insufficient documentation

## 2015-02-05 DIAGNOSIS — R112 Nausea with vomiting, unspecified: Secondary | ICD-10-CM | POA: Insufficient documentation

## 2015-02-05 DIAGNOSIS — R1011 Right upper quadrant pain: Secondary | ICD-10-CM | POA: Diagnosis present

## 2015-02-05 HISTORY — DX: Systemic involvement of connective tissue, unspecified: M35.9

## 2015-02-05 LAB — URINALYSIS COMPLETE WITH MICROSCOPIC (ARMC ONLY)
BACTERIA UA: NONE SEEN
BILIRUBIN URINE: NEGATIVE
Glucose, UA: NEGATIVE mg/dL
HGB URINE DIPSTICK: NEGATIVE
LEUKOCYTES UA: NEGATIVE
NITRITE: NEGATIVE
PH: 7 (ref 5.0–8.0)
Protein, ur: NEGATIVE mg/dL
Specific Gravity, Urine: 1.005 (ref 1.005–1.030)

## 2015-02-05 LAB — COMPREHENSIVE METABOLIC PANEL
ALT: 38 U/L (ref 14–54)
ANION GAP: 10 (ref 5–15)
AST: 69 U/L — ABNORMAL HIGH (ref 15–41)
Albumin: 3.3 g/dL — ABNORMAL LOW (ref 3.5–5.0)
Alkaline Phosphatase: 245 U/L — ABNORMAL HIGH (ref 38–126)
BUN: 11 mg/dL (ref 6–20)
CHLORIDE: 99 mmol/L — AB (ref 101–111)
CO2: 25 mmol/L (ref 22–32)
CREATININE: 0.82 mg/dL (ref 0.44–1.00)
Calcium: 9.1 mg/dL (ref 8.9–10.3)
Glucose, Bld: 118 mg/dL — ABNORMAL HIGH (ref 65–99)
POTASSIUM: 4.3 mmol/L (ref 3.5–5.1)
SODIUM: 134 mmol/L — AB (ref 135–145)
Total Bilirubin: 1.3 mg/dL — ABNORMAL HIGH (ref 0.3–1.2)
Total Protein: 8.1 g/dL (ref 6.5–8.1)

## 2015-02-05 LAB — CBC
HEMATOCRIT: 31 % — AB (ref 35.0–47.0)
HEMOGLOBIN: 9.8 g/dL — AB (ref 12.0–16.0)
MCH: 25 pg — AB (ref 26.0–34.0)
MCHC: 31.6 g/dL — ABNORMAL LOW (ref 32.0–36.0)
MCV: 79.4 fL — AB (ref 80.0–100.0)
Platelets: 243 10*3/uL (ref 150–440)
RBC: 3.91 MIL/uL (ref 3.80–5.20)
RDW: 23 % — ABNORMAL HIGH (ref 11.5–14.5)
WBC: 12 10*3/uL — AB (ref 3.6–11.0)

## 2015-02-05 LAB — LIPASE, BLOOD: LIPASE: 15 U/L (ref 11–51)

## 2015-02-05 MED ORDER — HYDROMORPHONE HCL 1 MG/ML IJ SOLN
0.5000 mg | INTRAMUSCULAR | Status: AC
  Administered 2015-02-05: 0.5 mg via INTRAVENOUS
  Filled 2015-02-05: qty 1

## 2015-02-05 MED ORDER — ONDANSETRON 8 MG PO TBDP: 8.0000 mg | ORAL_TABLET | Freq: Three times a day (TID) | ORAL | Status: DC | PRN

## 2015-02-05 MED ORDER — IOHEXOL 350 MG/ML SOLN
75.0000 mL | Freq: Once | INTRAVENOUS | Status: AC | PRN
  Administered 2015-02-05: 75 mL via INTRAVENOUS

## 2015-02-05 MED ORDER — RANITIDINE HCL 150 MG PO CAPS: 150.0000 mg | ORAL_CAPSULE | Freq: Two times a day (BID) | ORAL | Status: DC

## 2015-02-05 MED ORDER — SODIUM CHLORIDE 0.9 % IV BOLUS (SEPSIS)
1000.0000 mL | Freq: Once | INTRAVENOUS | Status: AC
  Administered 2015-02-05: 1000 mL via INTRAVENOUS

## 2015-02-05 MED ORDER — SUCRALFATE 1 G PO TABS: 1.0000 g | ORAL_TABLET | Freq: Four times a day (QID) | ORAL | Status: AC

## 2015-02-05 NOTE — ED Notes (Signed)
Pt reports that her right rib area has been hurting X 2 weeks. Pain worse when eating. Pt alert and oriented X4, active, cooperative, pt in NAD. RR even and unlabored, color WNL.

## 2015-02-05 NOTE — Discharge Instructions (Signed)

## 2015-02-05 NOTE — ED Notes (Signed)
To US

## 2015-02-05 NOTE — ED Provider Notes (Signed)
[facility] Emergency Department Provider Note  ____________________________________________  Time seen: 5:40 PM  I have reviewed the triage vital signs and the nursing notes.   HISTORY  Chief Complaint Abdominal Pain    HPI Beth Flores is a 54 y.o. female who complains of right lower chest pain or right upper quadrant abdominal pain is been hurting intermittently for 2 weeks. Worse when she eats and drinks. She has some nausea but no significant vomiting or diarrhea. Denies fever or chills. She does feel that the pain is worse with deep breathing as well. She does have stage IV breast cancer. Denies  shortness of breath, no exertional symptoms. Pain is aching, nonradiating, aggravated by eating and deep breathing, no alleviating symptoms. It's intermittent. Severe intensity.    Past Medical History  Diagnosis Date  . Rheumatoid arthritis ([place])   . Hypertension   . Cancer [facility]) 2012    breast   . Breast cancer ([place])   . Cancer of left breast, stage 4 ([place]) 06/25/2014  . Collagen vascular disease [facility])      Patient Active Problem List   Diagnosis Date Noted  . Cancer of left breast, stage 4 ([place]) 06/25/2014  . 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 Arthritis, degenerative 08/03/2013  . Rheumatoid arthritis with rheumatoid factor ([place]) 08/03/2013     Past Surgical History  Procedure Laterality Date  . Breast surgery  2010    lumpectomy  . Tubal ligation  1993  . Carpal tunnel release Bilateral   . Portacath placement  01/19/14     Current Outpatient Rx  Name  Route  Sig  Dispense  Refill  . Ferrous Sulfate (SLOW FE) 142 (45 FE) MG TBCR   Oral   Take 1 tablet by mouth daily.         . fluticasone (FLONASE) 50 MCG/ACT nasal spray      1 spray as needed for allergies.       0   . folic acid (FOLVITE) 1 MG tablet   Oral   Take 1 mg by mouth daily.         . hydrochlorothiazide (MICROZIDE) 12.5 MG capsule   Oral   Take 12.5 mg by mouth  daily.         . hydroxychloroquine (PLAQUENIL) 200 MG tablet            0   . ibuprofen (ADVIL,MOTRIN) 800 MG tablet   Oral   Take 800 mg by mouth every 8 (eight) hours as needed.         . lidocaine-prilocaine (EMLA) cream   Topical   Apply 1 application topically as needed. 30 min prior to accessing port   30 g   3   . loratadine (CLARITIN) 10 MG tablet   Oral   Take 10 mg by mouth daily.         . magnesium chloride (SLOW-MAG) 64 MG TBEC SR tablet   Oral   Take 1 tablet (64 mg total) by mouth 2 (two) times daily.   60 tablet   3   . nystatin (MYCOSTATIN) 100000 UNIT/ML suspension      take 5 milliliters by mouth four times a day   120 mL   0   . ondansetron (ZOFRAN ODT) 8 MG disintegrating tablet   Oral   Take 1 tablet (8 mg total) by mouth every 8 (eight) hours as needed for nausea or vomiting.   20 tablet   0   .  pantoprazole (PROTONIX) 40 MG tablet   Oral   Take 40 mg by mouth daily.         . promethazine (PHENERGAN) 25 MG tablet   Oral   Take 25 mg by mouth every 6 (six) hours as needed for nausea or vomiting.         . ranitidine (ZANTAC) 150 MG capsule   Oral   Take 1 capsule (150 mg total) by mouth 2 (two) times daily.   28 capsule   0   . sucralfate (CARAFATE) 1 G tablet   Oral   Take 1 tablet (1 g total) by mouth 4 (four) times daily.   120 tablet   1   . traMADol (ULTRAM) 50 MG tablet   Oral   Take 50 mg by mouth every 6 (six) hours as needed for moderate pain.         Marland Kitchen ZOVIRAX 400 MG tablet   Oral   Take 1 tablet (400 mg total) by mouth 5 (five) times daily. Take for 7 days then stop   35 tablet   0     Dispense as written.      Allergies Carboplatin   Family History  Problem Relation Age of Onset  . Cancer Father     colon    Social History Social History  Substance Use Topics  . Smoking status: Never Smoker   . Smokeless tobacco: Never Used  . Alcohol Use: No    Review of  Systems  Constitutional:   No fever or chills. No weight changes Eyes:   No blurry vision or double vision.  ENT:   No sore throat. Cardiovascular:   Positive as above chest pain. Respiratory:   No dyspnea or cough. Gastrointestinal:   Positive as above for abdominal pain, vomiting and diarrhea.  No BRBPR or melena. Genitourinary:   Negative for dysuria, urinary retention, bloody urine, or difficulty urinating. Musculoskeletal:   Negative for back pain. No joint swelling or pain. Skin:   Negative for rash. Neurological:   Negative for headaches, focal weakness or numbness. Psychiatric:  No anxiety or depression.   Endocrine:  No hot/cold intolerance, changes in energy, or sleep difficulty.  10-point ROS otherwise negative.  ____________________________________________   PHYSICAL EXAM:  VITAL SIGNS: ED Triage Vitals  Enc Vitals Group     BP 02/05/15 1604 127/89 mmHg     Pulse Rate 02/05/15 1604 138     Resp 02/05/15 1604 20     Temp 02/05/15 1604 100.2 F (37.9 C)     Temp Source 02/05/15 1604 Oral     SpO2 02/05/15 1604 98 %     Weight 02/05/15 1604 163 lb (73.936 kg)     Height 02/05/15 1604 5\' 3"  (1.6 m)     Head Cir --      Peak Flow --      Pain Score 02/05/15 1605 7     Pain Loc --      Pain Edu? --      Excl. in [place]? --     Vital signs reviewed, nursing assessments reviewed.   Constitutional:   Alert and oriented. Well appearing and in no distress. Eyes:   No scleral icterus. No conjunctival pallor. PERRL. EOMI ENT   Head:   Normocephalic and atraumatic.   Nose:   No congestion/rhinnorhea. No septal hematoma   Mouth/Throat:   MMM, no pharyngeal erythema. No peritonsillar mass. No uvula shift.   Neck:   No  stridor. No SubQ emphysema. No meningismus. Hematological/Lymphatic/Immunilogical:   No cervical lymphadenopathy. Cardiovascular:   Tachycardia heart rate 105. Normal and symmetric distal pulses are present in all extremities. No murmurs, rubs,  or gallops. Respiratory:   Normal respiratory effort without tachypnea nor retractions. Breath sounds are clear and equal bilaterally. No wheezes/rales/rhonchi. Gastrointestinal:   Soft with moderate right upper quadrant tenderness. No distention. There is no CVA tenderness.  No rebound, rigidity, or guarding. Genitourinary:   deferred Musculoskeletal:   Nontender with normal range of motion in all extremities. No joint effusions.  No lower extremity tenderness.  No edema. Neurologic:   Normal speech and language.  CN 2-10 normal. Motor grossly intact. No pronator drift.  Normal gait. No gross focal neurologic deficits are appreciated.  Skin:    Skin is warm, dry and intact. No rash noted.  No petechiae, purpura, or bullae. Psychiatric:   Mood and affect are normal. Speech and behavior are normal. Patient exhibits appropriate insight and judgment.  ____________________________________________    LABS (pertinent positives/negatives) (all labs ordered are listed, but only abnormal results are displayed) Labs Reviewed  COMPREHENSIVE METABOLIC PANEL - Abnormal; Notable for the following:    Sodium 134 (*)    Chloride 99 (*)    Glucose, Bld 118 (*)    Albumin 3.3 (*)    AST 69 (*)    Alkaline Phosphatase 245 (*)    Total Bilirubin 1.3 (*)    All other components within normal limits  CBC - Abnormal; Notable for the following:    WBC 12.0 (*)    Hemoglobin 9.8 (*)    HCT 31.0 (*)    MCV 79.4 (*)    MCH 25.0 (*)    MCHC 31.6 (*)    RDW 23.0 (*)    All other components within normal limits  URINALYSIS COMPLETEWITH MICROSCOPIC ([facility] ONLY) - Abnormal; Notable for the following:    Color, Urine YELLOW (*)    APPearance CLEAR (*)    Ketones, ur TRACE (*)    Squamous Epithelial / LPF 0-5 (*)    All other components within normal limits  LIPASE, BLOOD   ____________________________________________   EKG  Interpreted by me Sinus tachycardia rate 134, normal axis intervals QRS  and ST segments and T waves  ____________________________________________    RADIOLOGY  Chest x-ray unremarkable Ultrasound right upper quadrant shows diffuse liver metastases that are not new, no acute cholecystitis or cholelithiasis. CT angiogram chest unremarkable ____________________________________________   PROCEDURES   ____________________________________________   INITIAL IMPRESSION / ASSESSMENT AND PLAN / ED COURSE  Pertinent labs & imaging results that were available during my care of the patient were reviewed by me and considered in my medical decision making (see chart for details).  Patient presents with low-grade fever, tachycardia, right upper quadrant/right lower chest pain. With her symptoms and exam this is most concerning for cholecystitis, but ultrasound of the right upper quadrant was essentially unremarkable for new pathology. Her LFTs are essentially stable as well. However with her other history of stage IV cancer and the somewhat pleuritic nature of the symptoms and associated tachycardia, we also must rule out pulmonary embolism. Heparin for a CT angiogram of the chest. She is improved with IV fluids and if the angiogram is unremarkable she should be suitable for discharge home and follow-up with primary care.  ----------------------------------------- 10:08 PM on 02/05/2015 -----------------------------------------  Patient feels much better. Vital signs have normalized and are stable. Low suspicion for cholecystitis cholangitis AAA perforation  obstruction or cardiopulmonary pathology. At this point is most likely that she is having some degree of gastritis or other benign upper GI issue will start her on some antacids and provide her antiemetics and have her follow up closely with primary care.   ____________________________________________   FINAL CLINICAL IMPRESSION(S) / ED DIAGNOSES  Final diagnoses:  Upper abdominal pain      Carrie Mew, MD 02/05/15 2208

## 2015-02-05 NOTE — ED Notes (Signed)
Pt returned from u/s

## 2015-02-05 NOTE — ED Notes (Signed)
Pt also reports SOB and pain to right side with deep breaths

## 2015-02-10 ENCOUNTER — Inpatient Hospital Stay (HOSPITAL_BASED_OUTPATIENT_CLINIC_OR_DEPARTMENT_OTHER): Payer: Medicare Other | Admitting: Oncology

## 2015-02-10 ENCOUNTER — Encounter: Payer: Self-pay | Admitting: Oncology

## 2015-02-10 ENCOUNTER — Inpatient Hospital Stay: Payer: Medicare Other

## 2015-02-10 VITALS — BP 160/105 | HR 85 | Temp 97.0°F | Wt 161.4 lb

## 2015-02-10 DIAGNOSIS — C50912 Malignant neoplasm of unspecified site of left female breast: Secondary | ICD-10-CM

## 2015-02-10 DIAGNOSIS — I10 Essential (primary) hypertension: Secondary | ICD-10-CM

## 2015-02-10 DIAGNOSIS — Z800 Family history of malignant neoplasm of digestive organs: Secondary | ICD-10-CM

## 2015-02-10 DIAGNOSIS — C787 Secondary malignant neoplasm of liver and intrahepatic bile duct: Secondary | ICD-10-CM | POA: Diagnosis not present

## 2015-02-10 DIAGNOSIS — Z171 Estrogen receptor negative status [ER-]: Secondary | ICD-10-CM | POA: Diagnosis not present

## 2015-02-10 DIAGNOSIS — C7800 Secondary malignant neoplasm of unspecified lung: Secondary | ICD-10-CM | POA: Diagnosis not present

## 2015-02-10 DIAGNOSIS — M899 Disorder of bone, unspecified: Secondary | ICD-10-CM

## 2015-02-10 DIAGNOSIS — M069 Rheumatoid arthritis, unspecified: Secondary | ICD-10-CM

## 2015-02-10 DIAGNOSIS — R1011 Right upper quadrant pain: Secondary | ICD-10-CM

## 2015-02-10 DIAGNOSIS — Z5111 Encounter for antineoplastic chemotherapy: Secondary | ICD-10-CM | POA: Diagnosis not present

## 2015-02-10 DIAGNOSIS — Z79899 Other long term (current) drug therapy: Secondary | ICD-10-CM

## 2015-02-10 DIAGNOSIS — I998 Other disorder of circulatory system: Secondary | ICD-10-CM

## 2015-02-10 DIAGNOSIS — R978 Other abnormal tumor markers: Secondary | ICD-10-CM

## 2015-02-10 DIAGNOSIS — D649 Anemia, unspecified: Secondary | ICD-10-CM

## 2015-02-10 LAB — CBC WITH DIFFERENTIAL/PLATELET
BASOS ABS: 0 10*3/uL (ref 0–0.1)
Basophils Relative: 1 %
EOS PCT: 0 %
Eosinophils Absolute: 0 10*3/uL (ref 0–0.7)
HCT: 28.9 % — ABNORMAL LOW (ref 35.0–47.0)
HEMOGLOBIN: 9.2 g/dL — AB (ref 12.0–16.0)
LYMPHS ABS: 2 10*3/uL (ref 1.0–3.6)
LYMPHS PCT: 33 %
MCH: 25.1 pg — ABNORMAL LOW (ref 26.0–34.0)
MCHC: 32 g/dL (ref 32.0–36.0)
MCV: 78.4 fL — AB (ref 80.0–100.0)
Monocytes Absolute: 0.6 10*3/uL (ref 0.2–0.9)
Monocytes Relative: 9 %
NEUTROS PCT: 57 %
Neutro Abs: 3.3 10*3/uL (ref 1.4–6.5)
PLATELETS: 275 10*3/uL (ref 150–440)
RBC: 3.68 MIL/uL — AB (ref 3.80–5.20)
RDW: 22.7 % — ABNORMAL HIGH (ref 11.5–14.5)
WBC: 5.9 10*3/uL (ref 3.6–11.0)

## 2015-02-10 LAB — COMPREHENSIVE METABOLIC PANEL
ALK PHOS: 232 U/L — AB (ref 38–126)
ALT: 27 U/L (ref 14–54)
AST: 50 U/L — AB (ref 15–41)
Albumin: 2.9 g/dL — ABNORMAL LOW (ref 3.5–5.0)
Anion gap: 7 (ref 5–15)
BUN: 12 mg/dL (ref 6–20)
CALCIUM: 9.2 mg/dL (ref 8.9–10.3)
CHLORIDE: 100 mmol/L — AB (ref 101–111)
CO2: 23 mmol/L (ref 22–32)
CREATININE: 0.96 mg/dL (ref 0.44–1.00)
GFR calc Af Amer: >60 mL/min (ref >60)
Glucose, Bld: 90 mg/dL (ref 65–99)
Potassium: 3.7 mmol/L (ref 3.5–5.1)
SODIUM: 130 mmol/L — AB (ref 135–145)
Total Bilirubin: 0.5 mg/dL (ref 0.3–1.2)
Total Protein: 7.8 g/dL (ref 6.5–8.1)

## 2015-02-10 MED ORDER — HYDROCODONE-ACETAMINOPHEN 5-325 MG PO TABS: 1.0000 | ORAL_TABLET | ORAL | Status: DC | PRN

## 2015-02-10 MED ORDER — SODIUM CHLORIDE 0.9 % IJ SOLN
10.0000 mL | Freq: Once | INTRAMUSCULAR | Status: DC
  Filled 2015-02-10: qty 10

## 2015-02-10 MED ORDER — HEPARIN SOD (PORK) LOCK FLUSH 100 UNIT/ML IV SOLN
500.0000 [IU] | Freq: Once | INTRAVENOUS | Status: AC
  Administered 2015-02-10: 500 [IU] via INTRAVENOUS

## 2015-02-10 MED ORDER — HEPARIN SOD (PORK) LOCK FLUSH 100 UNIT/ML IV SOLN
INTRAVENOUS | Status: AC
  Filled 2015-02-10: qty 5

## 2015-02-10 NOTE — Progress Notes (Signed)
Patient seen in ED on Saturday for "stomach infection". She had an abdominal CT, [place] of gallbladder,  CXR, and EKG.   States her pulse rate was elevated and she was given fluids.  She stopped taking her BP medication Saturday as well.  BP today 160/105  HR 85.

## 2015-02-10 NOTE — Progress Notes (Signed)
Beth Flores @ [facility] [phone]  [phone]     Beth Flores OB: 18-Feb-1961  MR#: [id number]  GEZ#:[id number]  Patient Care Team: Jodi Marble, MD as PCP - General (Internal Medicine) Forest Gleason, MD (Unknown Physician Specialty) Christene Lye, MD (General Surgery) Julieanne Manson Leeanne Mannan., MD as Consulting Physician (Rheumatology)  CHIEF COMPLAINT:  Chief Complaint  Patient presents with  . Breast Cancer    Oncology History   1. Carcinoma of breast (left) diagnosis on June 14, by a core needle biopsy and lymph node biopsy. Estrogen receptor negative, progesterone receptor negative, HER-2 receptor negative. 2. Status post lumpectomy (January, 2012), 0.9 cm tumor, one positive lymph node.  Margins may be involved. AJCC Staging: [id number] Stage Grouping: status postchemotherapy and radiation treatment. 3.  MyRisk genetic mutation study is negative for any mutation. 4.  Patient also has had due to Rheumatoid arthritis on methotrexate tablet. 5.  PET scan shows progressive disease with multiple liver metastases and lung metastases (November, 18th, 2015)  6.  Patient was started on chemotherapy with carboplatinum /abraxene  day 1 day 8 schedule biopsy from jugular lymph node  was positive for metastatic breast cancer. 7.  Because of poor tolerance to chemotherapy with carboplatinum and Taxol patient was started on ERIBULIN from May of 2016           Oncology Flowsheet 10/14/2014 10/28/2014 11/11/2014 11/25/2014 12/09/2014 12/30/2014 01/20/2015  Day, Cycle Day 1, 6 Day 8, 6 Day 1, 6 Day 8, 6 Day 1, 6 Day 8, 6 Day 1, 6  dexamethasone (DECADRON) IV [ 10 mg ] [ 10 mg ] [ 10 mg ] [ 10 mg ] [ 10 mg ] [ 10 mg ] [ 10 mg ]  eriBULin mesylate (HALAVEN) IV 1.1 mg/m2 1.1 mg/m2 1.1 mg/m2 1.1 mg/m2 1.1 mg/m2 1.1 mg/m2 1.1 mg/m2  ondansetron (ZOFRAN) IV [ 8 mg ] [ 8 mg ] [ 8 mg ] [ 8 mg ] [ 8 mg ] [ 8 mg ] [ 8 mg ]    INTERVAL HISTORY  54 year old  African-American lady with a stage IV carcinoma of breast.  Triple negative disease. Patient is here for ongoing evaluation and treatment consideration. Patient started having right upper quadrant pain patient had a CT scan of abdomen done in December because of rising tumor marker.  Patient ended up in emergency room but further evaluation with ultrasound and CT scan and CT scan of chest was done which did not reveal any evidence of pulmonary embolism. Last CT scan has been reviewed by me and shows progressive disease. Abdominal pain is crampy pain off and on She  Has  finished taking Zovirax for suspected herpes zoster of the left side of the face.  It sees it is getting better.  REVIEW OF SYSTEMS:   ROS  general status: Patient is feeling somewhat stronger.Marland Kitchen  No change in a performance status.  No chills.  No fever. HEENT: Alopecia.  No evidence of stomatitis Lungs: No cough or shortness of breath Cardiac: No chest pain or paroxysmal nocturnal dyspnea GI: Having increasing abdominal pain.  Epigastric discomfort. Skin: No rash Lower extremity no swelling Neurological system: No tingling.  No numbness.  No other focal signs Musculoskeletal system no bony pains  As per HPI. Otherwise, a complete review of systems is negatve.  PAST MEDICAL HISTORY: Past Medical History  Diagnosis Date  . Rheumatoid arthritis ([place])   . Hypertension   . Cancer [facility]) 2012  breast   . Breast cancer ([place])   . Cancer of left breast, stage 4 ([place]) 06/25/2014  . Collagen vascular disease ([place])     PAST SURGICAL HISTORY: Past Surgical History  Procedure Laterality Date  . Breast surgery  2010    lumpectomy  . Tubal ligation  1993  . Carpal tunnel release Bilateral   . Portacath placement  01/19/14    FAMILY HISTORY Family History  Problem Relation Age of Onset  . Cancer Father     colon        ADVANCED DIRECTIVES:  patient was advised and discuss her living will.  All available resources  were offered   HEALTH MAINTENANCE: Social History  Substance Use Topics  . Smoking status: Never Smoker   . Smokeless tobacco: Never Used  . Alcohol Use: No      Allergies  Allergen Reactions  . Carboplatin Shortness Of Breath    Other reaction(s): Tight chest (finding)    Current Outpatient Prescriptions  Medication Sig Dispense Refill  . Ferrous Sulfate (SLOW FE) 142 (45 FE) MG TBCR Take 1 tablet by mouth daily.    . fluticasone (FLONASE) 50 MCG/ACT nasal spray 1 spray as needed for allergies.   0  . folic acid (FOLVITE) 1 MG tablet Take 1 mg by mouth daily.    . hydrochlorothiazide (MICROZIDE) 12.5 MG capsule Take 12.5 mg by mouth daily.    . hydroxychloroquine (PLAQUENIL) 200 MG tablet   0  . ibuprofen (ADVIL,MOTRIN) 800 MG tablet Take 800 mg by mouth every 8 (eight) hours as needed.    . lidocaine-prilocaine (EMLA) cream Apply 1 application topically as needed. 30 min prior to accessing port 30 g 3  . loratadine (CLARITIN) 10 MG tablet Take 10 mg by mouth daily.    . magnesium chloride (SLOW-MAG) 64 MG TBEC SR tablet Take 1 tablet (64 mg total) by mouth 2 (two) times daily. 60 tablet 3  . nystatin (MYCOSTATIN) 100000 UNIT/ML suspension take 5 milliliters by mouth four times a day 120 mL 0  . ondansetron (ZOFRAN ODT) 8 MG disintegrating tablet Take 1 tablet (8 mg total) by mouth every 8 (eight) hours as needed for nausea or vomiting. 20 tablet 0  . pantoprazole (PROTONIX) 40 MG tablet Take 40 mg by mouth daily.    . promethazine (PHENERGAN) 25 MG tablet Take 25 mg by mouth every 6 (six) hours as needed for nausea or vomiting.    . ranitidine (ZANTAC) 150 MG capsule Take 1 capsule (150 mg total) by mouth 2 (two) times daily. 28 capsule 0  . sucralfate (CARAFATE) 1 G tablet Take 1 tablet (1 g total) by mouth 4 (four) times daily. 120 tablet 1  . traMADol (ULTRAM) 50 MG tablet Take 50 mg by mouth every 6 (six) hours as needed for moderate pain.    Marland Kitchen ZOVIRAX 400 MG tablet Take 1  tablet (400 mg total) by mouth 5 (five) times daily. Take for 7 days then stop 35 tablet 0  . gabapentin (NEURONTIN) 300 MG capsule take 1 capsule by mouth three times a day  0   No current facility-administered medications for this visit.   Facility-Administered Medications Ordered in Other Visits  Medication Dose Route Frequency Provider Last Rate Last Dose  . heparin lock flush 100 unit/mL  500 Units Intravenous Once Forest Gleason, MD      . ondansetron (ZOFRAN) 8 mg, dexamethasone (DECADRON) 10 mg in sodium chloride 0.9 % 50 mL IVPB  Intravenous Once Forest Gleason, MD      . sodium chloride 0.9 % injection 10 mL  10 mL Intracatheter PRN Forest Gleason, MD      . sodium chloride 0.9 % injection 10 mL  10 mL Intravenous PRN Forest Gleason, MD   10 mL at 12/30/14 0903    OBJECTIVE: Filed Vitals:   02/10/15 1152  BP: 160/105  Pulse: 85  Temp: 97 F (36.1 C)     Body mass index is 28.59 kg/(m^2).    ECOG FS:1 - Symptomatic but completely ambulatory  Physical Exam general status: Performance status is good.  Patient has not lost significant weight HEENT: No evidence of stomatitis. Sclera and conjunctivae :: No jaundice.   pale looking.alopecia. There might be herpetic lesion in the left frontal lbone  Lungs: Air  entry equal on both sides.  No rhonchi.  No rales.  Cardiac: Heart sounds are normal.  No pericardial rub.  No murmur. Lymphatic system: Cervical, axillary, inguinal, lymph nodes not palpable GI: Abdomen is soft.  Palpable liver 3 finger below costal margin.  Tender.  Lower extremity: No edema Neurological system: Higher functions, cranial nerves intact no evidence of peripheral neuropathy. Skin: No rash.  No ecchymosis.. Examination of left breast shows nodular growth.  Right breast free of masses Left breast there is more drainage from the nodular growth. Left shoulder note swelling.  Tenderness.  LAB RESULTS: \        Lab Results  Component Value Date   WBC 5.9  02/10/2015   NEUTROABS 3.3 02/10/2015   HGB 9.2* 02/10/2015   HCT 28.9* 02/10/2015   MCV 78.4* 02/10/2015   PLT 275 02/10/2015      Chemistry         Lab Results  Component Value Date   LABCA2 106.4* 01/20/2015      ASSESSMENT and PlAN: Stage IV carcinoma breast CT scan done on December 15 as well as done in the emergency room has been reviewed independently.  It shows progressive disease Tumor markers are rising. Right upper quadrant pain is most likely due to progressing liver metastases I explained that to the patient and suggested she should take Vicodin as needed for pain  can stop taking gabapentin a but can continue Zantac and Carafate Patient has a progressing disease needed on ERIBULIN and would need to change chemotherapy either with cis-platinum and gemcitabine or cis-platinum and Abraxane versus Doxil. Get a MUGA scan of the heart and decide on a plan of treatment Prognosis as well as progressing disease has been discussed in detail

## 2015-02-11 LAB — CANCER ANTIGEN 27.29: CA 27.29: 131.9 U/mL — AB (ref 0.0–38.6)

## 2015-02-16 ENCOUNTER — Other Ambulatory Visit: Payer: Self-pay | Admitting: Nurse Practitioner

## 2015-02-17 ENCOUNTER — Encounter
Admission: RE | Admit: 2015-02-17 | Discharge: 2015-02-17 | Disposition: A | Discharge status: Home / Self Care | Payer: Medicare Other | Source: Ambulatory Visit | Attending: Oncology | Admitting: Oncology

## 2015-02-17 DIAGNOSIS — C50912 Malignant neoplasm of unspecified site of left female breast: Secondary | ICD-10-CM | POA: Diagnosis not present

## 2015-02-17 MED ORDER — TECHNETIUM TC 99M-LABELED RED BLOOD CELLS IV KIT
20.0000 | PACK | Freq: Once | INTRAVENOUS | Status: AC | PRN
  Administered 2015-02-17: 19.62 via INTRAVENOUS

## 2015-02-19 ENCOUNTER — Emergency Department: Payer: Medicare Other

## 2015-02-19 ENCOUNTER — Emergency Department
Admission: EM | Admit: 2015-02-19 | Discharge: 2015-02-19 | Disposition: A | Discharge status: Home / Self Care | Payer: Medicare Other | Attending: Emergency Medicine | Admitting: Emergency Medicine

## 2015-02-19 DIAGNOSIS — Z7952 Long term (current) use of systemic steroids: Secondary | ICD-10-CM | POA: Insufficient documentation

## 2015-02-19 DIAGNOSIS — Z79899 Other long term (current) drug therapy: Secondary | ICD-10-CM | POA: Insufficient documentation

## 2015-02-19 DIAGNOSIS — R531 Weakness: Secondary | ICD-10-CM | POA: Diagnosis present

## 2015-02-19 DIAGNOSIS — I10 Essential (primary) hypertension: Secondary | ICD-10-CM | POA: Insufficient documentation

## 2015-02-19 DIAGNOSIS — C7931 Secondary malignant neoplasm of brain: Secondary | ICD-10-CM | POA: Diagnosis not present

## 2015-02-19 DIAGNOSIS — Z7951 Long term (current) use of inhaled steroids: Secondary | ICD-10-CM | POA: Diagnosis not present

## 2015-02-19 DIAGNOSIS — C50912 Malignant neoplasm of unspecified site of left female breast: Secondary | ICD-10-CM | POA: Diagnosis not present

## 2015-02-19 LAB — CBC
HCT: 32 % — ABNORMAL LOW (ref 35.0–47.0)
Hemoglobin: 10.3 g/dL — ABNORMAL LOW (ref 12.0–16.0)
MCH: 26.2 pg (ref 26.0–34.0)
MCHC: 32.2 g/dL (ref 32.0–36.0)
MCV: 81.5 fL (ref 80.0–100.0)
PLATELETS: 231 10*3/uL (ref 150–440)
RBC: 3.93 MIL/uL (ref 3.80–5.20)
RDW: 24.5 % — ABNORMAL HIGH (ref 11.5–14.5)
WBC: 7.5 10*3/uL (ref 3.6–11.0)

## 2015-02-19 LAB — BASIC METABOLIC PANEL
ANION GAP: 8 (ref 5–15)
BUN: 11 mg/dL (ref 6–20)
CALCIUM: 9.4 mg/dL (ref 8.9–10.3)
CO2: 26 mmol/L (ref 22–32)
Chloride: 99 mmol/L — ABNORMAL LOW (ref 101–111)
Creatinine, Ser: 0.86 mg/dL (ref 0.44–1.00)
GFR calc non Af Amer: >60 mL/min (ref >60)
GLUCOSE: 117 mg/dL — AB (ref 65–99)
POTASSIUM: 3.9 mmol/L (ref 3.5–5.1)
Sodium: 133 mmol/L — ABNORMAL LOW (ref 135–145)

## 2015-02-19 LAB — URINALYSIS COMPLETE WITH MICROSCOPIC (ARMC ONLY)
BILIRUBIN URINE: NEGATIVE
Bacteria, UA: NONE SEEN
Glucose, UA: NEGATIVE mg/dL
KETONES UR: NEGATIVE mg/dL
Nitrite: NEGATIVE
PROTEIN: NEGATIVE mg/dL
Specific Gravity, Urine: 1.013 (ref 1.005–1.030)
pH: 6 (ref 5.0–8.0)

## 2015-02-19 MED ORDER — DEXAMETHASONE 4 MG PO TABS: 8.0000 mg | ORAL_TABLET | Freq: Three times a day (TID) | ORAL | Status: DC

## 2015-02-19 MED ORDER — DEXAMETHASONE SODIUM PHOSPHATE 10 MG/ML IJ SOLN
10.0000 mg | Freq: Once | INTRAMUSCULAR | Status: AC
  Administered 2015-02-19: 10 mg via INTRAVENOUS
  Filled 2015-02-19: qty 1

## 2015-02-19 MED ORDER — ONDANSETRON HCL 4 MG/2ML IJ SOLN
4.0000 mg | Freq: Once | INTRAMUSCULAR | Status: AC
  Administered 2015-02-19: 4 mg via INTRAVENOUS
  Filled 2015-02-19: qty 2

## 2015-02-19 NOTE — ED Notes (Addendum)
Pt reports feeling weakness, dizziness. Reports having difficulty holding things in her left hand.  Able to move left hand. Pt also c/o constipation.  Pt also endorses nausea.  Pt also reports that she get chemo treatment for breast cancer.

## 2015-02-19 NOTE — ED Provider Notes (Signed)
[facility] Emergency Department Provider Note   ____________________________________________  Time seen:  I have reviewed the triage vital signs and the triage nursing note.  HISTORY  Chief Complaint Weakness and Dizziness   Historian Patient and son  HPI Beth Flores is a 54 y.o. female with a history of recurrent breast cancer with possible metastases to the liver per the patient, whose last chemotherapy was early in December, is here for generalized weakness since yesterday along with nausea and a couple episodes of vomiting. She has had constipation for the last couple days as well. She tried magnesium laxative yesterday without results. Yesterday morning she also woke up with weakness of her left arm and left grip. This persists today. No facial droop or trouble finding words. No leg weakness or problems with coordination.    Past Medical History  Diagnosis Date  . Rheumatoid arthritis ([place])   . Hypertension   . Cancer [facility]) 2012    breast   . Breast cancer ([place])   . Cancer of left breast, stage 4 ([place]) 06/25/2014  . Collagen vascular disease [facility])     Patient Active Problem List   Diagnosis Date Noted  . Cancer of left breast, stage 4 ([place]) 06/25/2014  . 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 Arthritis, degenerative 08/03/2013  . Rheumatoid arthritis with rheumatoid factor ([place]) 08/03/2013  . Breast cancer ([place]) 08/03/2013    Past Surgical History  Procedure Laterality Date  . Breast surgery  2010    lumpectomy  . Tubal ligation  1993  . Carpal tunnel release Bilateral   . Portacath placement  01/19/14    Current Outpatient Rx  Name  Route  Sig  Dispense  Refill  . Ferrous Sulfate (SLOW FE) 142 (45 FE) MG TBCR   Oral   Take 1 tablet by mouth daily.         . fluticasone (FLONASE) 50 MCG/ACT nasal spray   Each Nare   Place 1 spray into both nostrils daily.       0   . folic acid (FOLVITE) 1 MG tablet   Oral   Take 1 mg by mouth  daily.         Marland Kitchen gabapentin (NEURONTIN) 300 MG capsule      take 1 capsule by mouth three times a day      0   . hydrochlorothiazide (MICROZIDE) 12.5 MG capsule   Oral   Take 12.5 mg by mouth daily.         Marland Kitchen HYDROcodone-acetaminophen (NORCO/VICODIN) 5-325 MG tablet   Oral   Take 1 tablet by mouth every 4 (four) hours as needed for moderate pain.   60 tablet   0   . ibuprofen (ADVIL,MOTRIN) 800 MG tablet   Oral   Take 800 mg by mouth every 8 (eight) hours as needed.         . lidocaine-prilocaine (EMLA) cream   Topical   Apply 1 application topically as needed. 30 min prior to accessing port   30 g   3   . loratadine (CLARITIN) 10 MG tablet   Oral   Take 10 mg by mouth daily.         . ondansetron (ZOFRAN ODT) 8 MG disintegrating tablet   Oral   Take 1 tablet (8 mg total) by mouth every 8 (eight) hours as needed for nausea or vomiting.   20 tablet   0   . pantoprazole (PROTONIX) 40 MG tablet   Oral  Take 40 mg by mouth daily.         . promethazine (PHENERGAN) 25 MG tablet   Oral   Take 25 mg by mouth every 6 (six) hours as needed for nausea or vomiting.         . ranitidine (ZANTAC) 150 MG capsule   Oral   Take 1 capsule (150 mg total) by mouth 2 (two) times daily.   28 capsule   0   . sucralfate (CARAFATE) 1 G tablet   Oral   Take 1 tablet (1 g total) by mouth 4 (four) times daily.   120 tablet   1   . dexamethasone (DECADRON) 4 MG tablet   Oral   Take 2 tablets (8 mg total) by mouth 3 (three) times daily.   84 tablet   0   . hydroxychloroquine (PLAQUENIL) 200 MG tablet   Oral   Take 200 mg by mouth daily.       0   . magnesium chloride (SLOW-MAG) 64 MG TBEC SR tablet   Oral   Take 1 tablet (64 mg total) by mouth 2 (two) times daily. Patient not taking: Reported on 02/19/2015   60 tablet   3   . nystatin (MYCOSTATIN) 100000 UNIT/ML suspension      take 5 milliliters by mouth four times a day Patient not taking: Reported on  02/19/2015   120 mL   0   . ZOVIRAX 400 MG tablet   Oral   Take 1 tablet (400 mg total) by mouth 5 (five) times daily. Take for 7 days then stop Patient not taking: Reported on 02/19/2015   35 tablet   0     Dispense as written.     Allergies Carboplatin  Family History  Problem Relation Age of Onset  . Cancer Father     colon    Social History Social History  Substance Use Topics  . Smoking status: Never Smoker   . Smokeless tobacco: Never Used  . Alcohol Use: No    Review of Systems  Constitutional: Negative for fever. Eyes: Negative for visual changes. ENT: Negative for sore throat. Cardiovascular: Negative for chest pain. Respiratory: Negative for shortness of breath. Gastrointestinal: Negative for abdominal pain. Genitourinary: Negative for dysuria. Musculoskeletal: Negative for back pain. Skin: Negative for rash. Neurological: Negative for headache. 10 point Review of Systems otherwise negative ____________________________________________   PHYSICAL EXAM:  VITAL SIGNS: ED Triage Vitals  Enc Vitals Group     BP 02/19/15 1918 140/91 mmHg     Pulse Rate 02/19/15 1918 111     Resp 02/19/15 1918 18     Temp 02/19/15 1918 98.1 F (36.7 C)     Temp Source 02/19/15 1918 Oral     SpO2 02/19/15 1918 98 %     Weight 02/19/15 1918 161 lb (73.029 kg)     Height 02/19/15 1918 5' (1.524 m)     Head Cir --      Peak Flow --      Pain Score --      Pain Loc --      Pain Edu? --      Excl. in [place]? --      Constitutional: Alert and oriented. Well appearing and in no distress. Eyes: Conjunctivae are normal. PERRL. Normal extraocular movements. ENT   Head: Normocephalic and atraumatic.   Nose: No congestion/rhinnorhea.   Mouth/Throat: Mucous membranes are moist.   Neck: No stridor. Cardiovascular/Chest: Normal rate, regular rhythm.  No murmurs, rubs, or gallops. Respiratory: Normal respiratory effort without tachypnea nor retractions. Breath  sounds are clear and equal bilaterally. No wheezes/rales/rhonchi. Gastrointestinal: Soft. No distention, no guarding, no rebound. Nontender   Genitourinary/rectal:Deferred Musculoskeletal: Nontender with normal range of motion in all extremities. No joint effusions.  No lower extremity tenderness.  No edema. Neurologic:  Normal speech and language. Cranial nerves II through X are intact. Left grip strength in the left biceps and triceps 4-5 strength. Right upper extremity and bilateral lower extremities 5 out of 5 in strength. Skin:  Skin is warm, dry and intact. No rash noted. Psychiatric: Mood and affect are normal. Speech and behavior are normal. Patient exhibits appropriate insight and judgment.  ____________________________________________   EKG I, Lisa Roca, MD, the attending physician have personally viewed and interpreted all ECGs.  107 bpm. Sinus tachycardia. Narrow QRS. Normal axis. Normal ST and T-wave ____________________________________________  LABS (pertinent positives/negatives)  Urinalysis negative Basic metabolic panel without significant abnormality White blood count 7.5 and hemoglobin 10.3 and platelet count 231  ____________________________________________  RADIOLOGY All Xrays were viewed by me. Imaging interpreted by Radiologist.  Discussed results by phone with radiologist.  CT noncontrast:  IMPRESSION: Regions of edema and decreased attenuation within both cerebral hemispheres, and within the cerebellum bilaterally, with a few foci of associated increased attenuation. These are compatible with multiple metastatic lesions throughout the brain, given the patient's history of metastatic breast cancer. There are least 8 lesions visualized. Associated mass effect, with approximately 4 mm of leftward midline shift.  MRI of the brain with contrast would be helpful for further evaluation, when and as deemed clinically appropriate.  Critical Value/emergent  results were called by telephone at the time of interpretation on 02/19/2015 at 10:34 pm to Dr. Lisa Roca, who verbally acknowledged these results. __________________________________________  PROCEDURES  Procedure(s) performed: None  Critical Care performed: None  ____________________________________________   ED COURSE / ASSESSMENT AND PLAN  CONSULTATIONS:  Dr. Grayland Ormond, oncology - recommends ok for outpatient with dexamethasone 8 mg by mouth every 8 hours. Patient will be called on Tuesday morning to start radiation to the brain.  Pertinent labs & imaging results that were available during my care of the patient were reviewed by me and considered in my medical decision making (see chart for details).  The most concerning part of patient's history and physical exam is new left arm weakness, that she woke up with yesterday. Her head CT shows the source of this is new multiple brain metastases with edema and slight shift.  Patient is overall in good spirits after the discussion, and would like to go home with dexamethasone.  I discussed the case with the on-call oncologist, Dr. Grayland Ormond, who states the next treatment would be brain radiation, and this would not start till Tuesday, and so if patient is stable and interested in going home, she can come back on Tuesday to start radiation.    Patient / Family / Caregiver informed of clinical course, medical decision-making process, and agree with plan.   I discussed return precautions, follow-up instructions, and discharged instructions with patient and/or family.  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

## 2015-02-19 NOTE — Discharge Instructions (Signed)
We discussed for constipation, continue using magnesium laxative. Discussed with the primary doctor or oncologist next week if you still have not had adequate bowel movement.  You were evaluated for nausea and generalized weakness as well as left arm weakness, and were found to have new cancer metastasis to the brain. You're being started on steroid dexamethasone (decadron) for brain swelling and nausea.  Return to the emergency department for any new or worsening symptoms including headache, nausea, new weakness or numbness, any confusion or altered mental status.   Metastatic Brain Tumor A metastatic brain tumor is a growth in your brain. It is made of cancer cells from another part of your body that traveled to your brain through your bloodstream, along the nerves of your brain (cranial nerves), or through the opening at the base of your skull.  CAUSES  The most common cause of a metastatic brain tumor in men is lung cancer. In women, it is breast cancer. Other common causes include:  Unknown types of cancers.  Skin cancer (melanoma).  Colon cancer.  Kidney cancer. SIGNS AND SYMPTOMS  Most signs and symptoms of metastatic brain tumors are caused by increased pressure inside your brain. A headache is often the first symptom. Eventually, almost everyone with a metastatic brain tumor has a headache. Other signs and symptoms include:  Vomiting.  Weakness or fatigue.  Seizures.  Sensory problems, like tingling or numbness.  Problems walking.  Clumsiness.  Emotional changes.  Personality changes.  Changes in speech or vision. DIAGNOSIS  Your health care provider can diagnose a metastatic brain tumor from your medical history and physical exam. You may also have some additional tests, including:  A nervous system function test (neurologic exam).  Imaging studies of your brain, such as an MRI and CT scan.  Having a small piece of tissue removed (biopsy) from your brain to be  checked under a microscope. TREATMENT  Treatment depends on the type of cancer you have and your overall health. It also depends on the size of your tumor and the number of brain tumors you have. The most common treatments include:  Medicines to control pain, nausea, and seizures.  Cancer-killing drugs (chemotherapy).  An X-ray treatment called radiation therapy to kill brain tumor cells.  A type of radiation therapy that is targeted to exact locations in the brain (stereotactic radiotherapy).  Surgery to remove brain tumors. HOME CARE INSTRUCTIONS  Only take medicines as directed by your health care provider.  Do not drive if:  You are taking strong pain medicines.  Your vision or thinking is not clear.  Take two 10-minute walks every day if you are able. Exercising is a good way to relieve stress. It can also help you sleep.  Be sure to get enough calories and protein in your diet.  If you have a poor appetite or feel nauseous, eat small meals often.  Supplement your diet with protein shakes or milk shakes.  It is common to have anxiety and depression when you are coping with cancer.  Talk to friends and loved ones about your feelings.  Have a good support system at home. SEEK MEDICAL CARE IF:  You have chills or fever.  Your medicine is not controlling your symptoms.  Your symptoms get worse or you develop new symptoms.  You are having trouble caring for yourself or being cared for at home.  You are struggling with anxiety or depression. SEEK IMMEDIATE MEDICAL CARE IF:  You cannot stop vomiting.  You  cannot keep down any foods or fluids.  You have sudden changes in speech or vision.  You have a seizure.  You can no longer take care of yourself at home.   This information is not intended to replace advice given to you by your health care provider. Make sure you discuss any questions you have with your health care provider.   Document Released: 11/02/2003  Document Revised: 02/26/2014 Document Reviewed: 01/27/2013 Elsevier Interactive Patient Education [employer].

## 2015-02-23 ENCOUNTER — Ambulatory Visit
Admission: RE | Admit: 2015-02-23 | Discharge: 2015-02-23 | Disposition: A | Discharge status: Home / Self Care | Payer: Medicare Other | Source: Ambulatory Visit | Attending: Radiation Oncology | Admitting: Radiation Oncology

## 2015-02-23 ENCOUNTER — Encounter: Payer: Self-pay | Admitting: Radiation Oncology

## 2015-02-23 VITALS — BP 153/98 | HR 85 | Temp 97.6°F | Resp 20 | Wt 153.4 lb

## 2015-02-23 DIAGNOSIS — C7800 Secondary malignant neoplasm of unspecified lung: Secondary | ICD-10-CM | POA: Insufficient documentation

## 2015-02-23 DIAGNOSIS — Z510 Encounter for antineoplastic radiation therapy: Secondary | ICD-10-CM | POA: Insufficient documentation

## 2015-02-23 DIAGNOSIS — Z171 Estrogen receptor negative status [ER-]: Secondary | ICD-10-CM | POA: Insufficient documentation

## 2015-02-23 DIAGNOSIS — Z853 Personal history of malignant neoplasm of breast: Secondary | ICD-10-CM | POA: Insufficient documentation

## 2015-02-23 DIAGNOSIS — C7931 Secondary malignant neoplasm of brain: Secondary | ICD-10-CM

## 2015-02-23 DIAGNOSIS — C787 Secondary malignant neoplasm of liver and intrahepatic bile duct: Secondary | ICD-10-CM | POA: Insufficient documentation

## 2015-02-23 DIAGNOSIS — Z7952 Long term (current) use of systemic steroids: Secondary | ICD-10-CM | POA: Insufficient documentation

## 2015-02-23 DIAGNOSIS — Z79899 Other long term (current) drug therapy: Secondary | ICD-10-CM | POA: Insufficient documentation

## 2015-02-23 NOTE — Consult Note (Signed)
Except an outstanding is perfect of Radiation Oncology NEW PATIENT EVALUATION  Name: Beth Flores  MRN: [id number]  Date:   02/23/2015     DOB: April 26, 1960   This 55 y.o. female patient presents to the clinic for initial evaluation of brain metastases from breast cancer.  REFERRING PHYSICIAN: Jodi Marble, MD  CHIEF COMPLAINT:  Chief Complaint  Patient presents with  . Cancer    Pt is here for initial consultation for brain metastasis    DIAGNOSIS: The encounter diagnosis was Brain metastasis ([place]).   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 She presented with PET scan showing progressive stage IV disease with liver metastasis and lung metastasis in November 2015 she was started on carboplatinum and Abraxane but because of poor tolerance was switched toEribulin. She most recently presented to emergency room with loss of strength in the left upper extremity headaches and balance and incoordination issues had a CT scan of her brain showing at least 8 metastatic focus throughout her brain and cerebellum. She was started on steroids and has improved considerably with decrease in headaches and return of function to her left upper extremity. I been asked to evaluate her for palliative radiation therapy for brain metastasis.   PLANNED TREATMENT REGIMEN: Whole brain radiation  PAST MEDICAL HISTORY:  has a past medical history of Rheumatoid arthritis ([place]); Hypertension; Cancer ([place]) (2012); Breast cancer ([place]); Cancer of left breast, stage 4 ([place]) (06/25/2014); and Collagen vascular disease ([place]).    PAST SURGICAL HISTORY:  Past Surgical History  Procedure  Laterality Date  . Breast surgery  2010    lumpectomy  . Tubal ligation  1993  . Carpal tunnel release Bilateral   . Portacath placement  01/19/14    FAMILY HISTORY: family history includes Cancer in her father.  SOCIAL HISTORY:  reports that she has never smoked. She has never used smokeless tobacco. She reports that she does not drink alcohol or use illicit drugs.  ALLERGIES: Carboplatin  MEDICATIONS:  Current Outpatient Prescriptions  Medication Sig Dispense Refill  . dexamethasone (DECADRON) 4 MG tablet Take 2 tablets (8 mg total) by mouth 3 (three) times daily. 84 tablet 0  . Ferrous Sulfate (SLOW FE) 142 (45 FE) MG TBCR Take 1 tablet by mouth daily.    . fluticasone (FLONASE) 50 MCG/ACT nasal spray Place 1 spray into both nostrils daily.   0  . folic acid (FOLVITE) 1 MG tablet Take 1 mg by mouth daily.    Marland Kitchen gabapentin (NEURONTIN) 300 MG capsule take 1 capsule by mouth three times a day  0  . hydrochlorothiazide (MICROZIDE) 12.5 MG capsule Take 12.5 mg by mouth daily.    Marland Kitchen HYDROcodone-acetaminophen (NORCO/VICODIN) 5-325 MG tablet Take 1 tablet by mouth every 4 (four) hours as needed for moderate pain. 60 tablet 0  . hydroxychloroquine (PLAQUENIL) 200 MG tablet Take 200 mg by mouth daily.   0  . ibuprofen (ADVIL,MOTRIN) 800 MG tablet Take 800 mg by mouth every 8 (eight) hours as needed.    . lidocaine-prilocaine (EMLA) cream Apply 1 application topically as needed. 30 min prior to accessing port 30 g 3  . loratadine (CLARITIN) 10 MG tablet Take 10  mg by mouth daily.    . magnesium chloride (SLOW-MAG) 64 MG TBEC SR tablet Take 1 tablet (64 mg total) by mouth 2 (two) times daily. 60 tablet 3  . nystatin (MYCOSTATIN) 100000 UNIT/ML suspension take 5 milliliters by mouth four times a day 120 mL 0  . ondansetron (ZOFRAN ODT) 8 MG disintegrating tablet Take 1 tablet (8 mg total) by mouth every 8 (eight) hours as needed for nausea or vomiting. 20 tablet 0  . pantoprazole (PROTONIX) 40 MG  tablet Take 40 mg by mouth daily.    . promethazine (PHENERGAN) 25 MG tablet Take 25 mg by mouth every 6 (six) hours as needed for nausea or vomiting.    . ranitidine (ZANTAC) 150 MG capsule Take 1 capsule (150 mg total) by mouth 2 (two) times daily. 28 capsule 0  . sucralfate (CARAFATE) 1 G tablet Take 1 tablet (1 g total) by mouth 4 (four) times daily. 120 tablet 1  . ZOVIRAX 400 MG tablet Take 1 tablet (400 mg total) by mouth 5 (five) times daily. Take for 7 days then stop 35 tablet 0   No current facility-administered medications for this encounter.   Facility-Administered Medications Ordered in Other Encounters  Medication Dose Route Frequency Provider Last Rate Last Dose  . heparin lock flush 100 unit/mL  500 Units Intravenous Once Forest Gleason, MD      . ondansetron (ZOFRAN) 8 mg, dexamethasone (DECADRON) 10 mg in sodium chloride 0.9 % 50 mL IVPB   Intravenous Once Forest Gleason, MD      . sodium chloride 0.9 % injection 10 mL  10 mL Intracatheter PRN Forest Gleason, MD      . sodium chloride 0.9 % injection 10 mL  10 mL Intravenous PRN Forest Gleason, MD   10 mL at 12/30/14 0903    ECOG PERFORMANCE STATUS:  1 - Symptomatic but completely ambulatory  REVIEW OF SYSTEMS: Except for the neurologic symptoms as described above Patient denies any weight loss, fatigue, weakness, fever, chills or night sweats. Patient denies any loss of vision, blurred vision. Patient denies any ringing  of the ears or hearing loss. No irregular heartbeat. Patient denies heart murmur or history of fainting. Patient denies any chest pain or pain radiating to her upper extremities. Patient denies any shortness of breath, difficulty breathing at night, cough or hemoptysis. Patient denies any swelling in the lower legs. Patient denies any nausea vomiting, vomiting of blood, or coffee ground material in the vomitus. Patient denies any stomach pain. Patient states has had normal bowel movements no significant constipation or  diarrhea. Patient denies any dysuria, hematuria or significant nocturia. Patient denies any problems walking, swelling in the joints or loss of balance. Patient denies any skin changes, loss of hair or loss of weight. Patient denies any excessive worrying or anxiety or significant depression. Patient denies any problems with insomnia. Patient denies excessive thirst, polyuria, polydipsia. Patient denies any swollen glands, patient denies easy bruising or easy bleeding. Patient denies any recent infections, allergies or URI. Patient "s visual fields have not changed significantly in recent time.    PHYSICAL EXAM: BP 153/98 mmHg  Pulse 85  Temp(Src) 97.6 F (36.4 C)  Resp 20  Wt 153 lb 7 oz (69.6 kg) Crude visual fields are within normal range she has bilateral cataracts making retinal examination difficult. Cranial nerves II through XII are grossly intact. Motor sensory and DTR levels appear equal and symmetric in the upper and lower extremities. Well-developed well-nourished patient in  NAD. HEENT reveals PERLA, EOMI, discs not visualized.  Oral cavity is clear. No oral mucosal lesions are identified. Neck is clear without evidence of cervical or supraclavicular adenopathy. Lungs are clear to A&P. Cardiac examination is essentially unremarkable with regular rate and rhythm without murmur rub or thrill. Abdomen is benign with no organomegaly or masses noted. Motor sensory and DTR levels are equal and symmetric in the upper and lower extremities. Cranial nerves II through XII are grossly intact. Proprioception is intact. No peripheral adenopathy or edema is identified. No motor or sensory levels are noted. Crude visual fields are within normal range.  LABORATORY DATA: Laboratory data is reviewed    RADIOLOGY RESULTS: PET CT scan is reviewed compatible with the above-stated findings   IMPRESSION: Widespread brain metastasis in patient with known triple negative breast cancer stage IV disease  PLAN:  At this time like to start whole brain radiation therapy. I will plan on delivering 3000 cGy in 10 fractions. She will continue on steroid therapy at this time she is effective in controlling her neurologic symptoms. I have set up and ordered CT simulation for later this week and will start treatments early next week. Risks and benefits of treatment including possible cognitive decline, skin reaction fatigue hair loss all were described in detail to the patient. 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  I would like to take this opportunity for allowing me to participate in the care of your patient.Armstead Peaks., MD

## 2015-02-24 ENCOUNTER — Inpatient Hospital Stay: Payer: Medicare Other

## 2015-02-24 ENCOUNTER — Inpatient Hospital Stay: Payer: Medicare Other | Attending: Oncology

## 2015-02-24 ENCOUNTER — Inpatient Hospital Stay (HOSPITAL_BASED_OUTPATIENT_CLINIC_OR_DEPARTMENT_OTHER): Payer: Medicare Other | Admitting: Oncology

## 2015-02-24 VITALS — BP 154/96 | HR 80 | Temp 96.6°F | Wt 154.5 lb

## 2015-02-24 DIAGNOSIS — R1013 Epigastric pain: Secondary | ICD-10-CM | POA: Diagnosis not present

## 2015-02-24 DIAGNOSIS — Z800 Family history of malignant neoplasm of digestive organs: Secondary | ICD-10-CM

## 2015-02-24 DIAGNOSIS — Z923 Personal history of irradiation: Secondary | ICD-10-CM | POA: Diagnosis not present

## 2015-02-24 DIAGNOSIS — M069 Rheumatoid arthritis, unspecified: Secondary | ICD-10-CM

## 2015-02-24 DIAGNOSIS — C787 Secondary malignant neoplasm of liver and intrahepatic bile duct: Secondary | ICD-10-CM | POA: Diagnosis not present

## 2015-02-24 DIAGNOSIS — Z171 Estrogen receptor negative status [ER-]: Secondary | ICD-10-CM | POA: Insufficient documentation

## 2015-02-24 DIAGNOSIS — Z79899 Other long term (current) drug therapy: Secondary | ICD-10-CM

## 2015-02-24 DIAGNOSIS — Z9221 Personal history of antineoplastic chemotherapy: Secondary | ICD-10-CM | POA: Diagnosis not present

## 2015-02-24 DIAGNOSIS — D649 Anemia, unspecified: Secondary | ICD-10-CM | POA: Insufficient documentation

## 2015-02-24 DIAGNOSIS — M359 Systemic involvement of connective tissue, unspecified: Secondary | ICD-10-CM

## 2015-02-24 DIAGNOSIS — I10 Essential (primary) hypertension: Secondary | ICD-10-CM

## 2015-02-24 DIAGNOSIS — F419 Anxiety disorder, unspecified: Secondary | ICD-10-CM | POA: Diagnosis not present

## 2015-02-24 DIAGNOSIS — C7800 Secondary malignant neoplasm of unspecified lung: Secondary | ICD-10-CM

## 2015-02-24 DIAGNOSIS — R451 Restlessness and agitation: Secondary | ICD-10-CM | POA: Diagnosis not present

## 2015-02-24 DIAGNOSIS — C50912 Malignant neoplasm of unspecified site of left female breast: Secondary | ICD-10-CM

## 2015-02-24 DIAGNOSIS — C7931 Secondary malignant neoplasm of brain: Secondary | ICD-10-CM | POA: Diagnosis not present

## 2015-02-24 LAB — CBC WITH DIFFERENTIAL/PLATELET
BASOS ABS: 0.1 10*3/uL (ref 0–0.1)
BASOS PCT: 1 %
EOS PCT: 0 %
Eosinophils Absolute: 0 10*3/uL (ref 0–0.7)
HEMATOCRIT: 34.9 % — AB (ref 35.0–47.0)
Hemoglobin: 11.2 g/dL — ABNORMAL LOW (ref 12.0–16.0)
Lymphocytes Relative: 20 %
Lymphs Abs: 2.1 10*3/uL (ref 1.0–3.6)
MCH: 26.2 pg (ref 26.0–34.0)
MCHC: 32.2 g/dL (ref 32.0–36.0)
MCV: 81.4 fL (ref 80.0–100.0)
MONO ABS: 1.1 10*3/uL — AB (ref 0.2–0.9)
Monocytes Relative: 11 %
NEUTROS ABS: 6.9 10*3/uL — AB (ref 1.4–6.5)
Neutrophils Relative %: 68 %
PLATELETS: 282 10*3/uL (ref 150–440)
RBC: 4.29 MIL/uL (ref 3.80–5.20)
RDW: 24.4 % — AB (ref 11.5–14.5)
WBC: 10.2 10*3/uL (ref 3.6–11.0)

## 2015-02-24 LAB — COMPREHENSIVE METABOLIC PANEL
ALBUMIN: 3.5 g/dL (ref 3.5–5.0)
ALT: 53 U/L (ref 14–54)
AST: 64 U/L — AB (ref 15–41)
Alkaline Phosphatase: 297 U/L — ABNORMAL HIGH (ref 38–126)
Anion gap: 12 (ref 5–15)
BUN: 37 mg/dL — AB (ref 6–20)
CHLORIDE: 100 mmol/L — AB (ref 101–111)
CO2: 21 mmol/L — ABNORMAL LOW (ref 22–32)
Calcium: 9.3 mg/dL (ref 8.9–10.3)
Creatinine, Ser: 1.13 mg/dL — ABNORMAL HIGH (ref 0.44–1.00)
GFR calc Af Amer: >60 mL/min (ref >60)
GFR calc non Af Amer: 54 mL/min — ABNORMAL LOW (ref >60)
GLUCOSE: 160 mg/dL — AB (ref 65–99)
POTASSIUM: 3.9 mmol/L (ref 3.5–5.1)
SODIUM: 133 mmol/L — AB (ref 135–145)
Total Bilirubin: 0.6 mg/dL (ref 0.3–1.2)
Total Protein: 8.5 g/dL — ABNORMAL HIGH (ref 6.5–8.1)

## 2015-02-24 MED ORDER — SODIUM CHLORIDE 0.9 % IV SOLN: Freq: Once | INTRAVENOUS | Status: DC

## 2015-02-24 MED ORDER — SODIUM CHLORIDE 0.9 % IJ SOLN
10.0000 mL | INTRAMUSCULAR | Status: DC | PRN
Start: 2015-02-24 — End: 2015-02-24
  Administered 2015-02-24: 10 mL via INTRAVENOUS
  Filled 2015-02-24: qty 10

## 2015-02-24 MED ORDER — HEPARIN SOD (PORK) LOCK FLUSH 100 UNIT/ML IV SOLN
500.0000 [IU] | Freq: Once | INTRAVENOUS | Status: AC
  Administered 2015-02-24: 500 [IU] via INTRAVENOUS
  Filled 2015-02-24: qty 5

## 2015-02-24 MED ORDER — DEXAMETHASONE 4 MG PO TABS: 8.0000 mg | ORAL_TABLET | Freq: Two times a day (BID) | ORAL | Status: DC

## 2015-02-24 MED ORDER — DOXORUBICIN HCL LIPOSOMAL CHEMO INJECTION 2 MG/ML: 15.0000 mg/m2 | Freq: Once | INTRAVENOUS | Status: DC

## 2015-02-24 MED ORDER — ONDANSETRON 8 MG PO TBDP: 8.0000 mg | ORAL_TABLET | Freq: Three times a day (TID) | ORAL | Status: DC | PRN

## 2015-02-24 NOTE — Progress Notes (Signed)
Patient states she went to ED New Year's Eve for dizziness, nausea and vomiting. She also lost the feeling in her left hand.   CT of head revealed 3 tumors on left side of head.

## 2015-02-25 ENCOUNTER — Encounter: Payer: Self-pay | Admitting: Oncology

## 2015-02-25 ENCOUNTER — Ambulatory Visit
Admission: RE | Admit: 2015-02-25 | Discharge: 2015-02-25 | Disposition: A | Discharge status: Home / Self Care | Payer: Medicare Other | Source: Ambulatory Visit | Attending: Radiation Oncology | Admitting: Radiation Oncology

## 2015-02-25 ENCOUNTER — Other Ambulatory Visit: Payer: Self-pay | Admitting: *Deleted

## 2015-02-25 DIAGNOSIS — C7931 Secondary malignant neoplasm of brain: Secondary | ICD-10-CM | POA: Diagnosis not present

## 2015-02-25 DIAGNOSIS — Z171 Estrogen receptor negative status [ER-]: Secondary | ICD-10-CM | POA: Diagnosis not present

## 2015-02-25 DIAGNOSIS — C787 Secondary malignant neoplasm of liver and intrahepatic bile duct: Secondary | ICD-10-CM | POA: Diagnosis not present

## 2015-02-25 DIAGNOSIS — C7800 Secondary malignant neoplasm of unspecified lung: Secondary | ICD-10-CM | POA: Diagnosis not present

## 2015-02-25 DIAGNOSIS — Z79899 Other long term (current) drug therapy: Secondary | ICD-10-CM | POA: Diagnosis not present

## 2015-02-25 DIAGNOSIS — Z853 Personal history of malignant neoplasm of breast: Secondary | ICD-10-CM | POA: Diagnosis not present

## 2015-02-25 DIAGNOSIS — Z7952 Long term (current) use of systemic steroids: Secondary | ICD-10-CM | POA: Diagnosis not present

## 2015-02-25 DIAGNOSIS — Z510 Encounter for antineoplastic radiation therapy: Secondary | ICD-10-CM | POA: Diagnosis present

## 2015-02-25 NOTE — Progress Notes (Signed)
[place] @ [facility] [phone]) [phone]  Fax:(336) [phone]     Beth Flores OB: December 07, 1960  MR#: [id number]  NTZ#:[id number]  Patient Care Team: Jodi Marble, MD as PCP - General (Internal Medicine) Forest Gleason, MD (Unknown Physician Specialty) Christene Lye, MD (General Surgery) Julieanne Manson Leeanne Mannan., MD as Consulting Physician (Rheumatology)  CHIEF COMPLAINT:  Chief Complaint  Patient presents with  . Breast Cancer    Oncology History   1. Carcinoma of breast (left) diagnosis on June 14, by a core needle biopsy and lymph node biopsy. Estrogen receptor negative, progesterone receptor negative, HER-2 receptor negative. 2. Status post lumpectomy (January, 2012), 0.9 cm tumor, one positive lymph node.  Margins may be involved. AJCC Staging: [id number]_0 Stage Grouping: status postchemotherapy and radiation treatment. 3.  MyRisk genetic mutation study is negative for any mutation. 4.  Patient also has had due to Rheumatoid arthritis on methotrexate tablet. 5.  PET scan shows progressive disease with multiple liver metastases and lung metastases (November, 18th, 2015)  6.  Patient was started on chemotherapy with carboplatinum /abraxene  day 1 day 8 schedule biopsy from jugular lymph node  was positive for metastatic breast cancer. 7.  Because of poor tolerance to chemotherapy with carboplatinum and Taxol patient was started on ERIBULIN from May of 2016       8.  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  Started radiation therapy in January of 2017 Progressing disease in the liver and lung by CT scan in December of 2016.  eribulin was discontinued      INTERVAL HISTORY  [name] with a stage IV carcinoma of breast.  Triple negative disease. Patient is here for ongoing evaluation and treatment consideration. Patient meant to emergency room with increasing headache dizziness and significant nausea and vomiting.  CT scan  without contrast revealed multiple brain metastases patient was started on steroid was evaluated by Dr. Baruch Gouty for radiation therapy. She is on 8 mg of Decadron 3 times a day.  Extremely agitated and anxious.  No nausea no vomiting at present time  REVIEW OF SYSTEMS:   ROS Gen. status: Patient is somewhat anxious.  And agitated.  On 24 mg of Decadron daily HEENT: Soreness in the mouth but no difficulty swallowing no headache Lungs: Dry hacking cough.  Shortness of breath on exertion.  No hemoptysis no chest pain GI: No nausea no vomiting no diarrhea GU: No dysuria or hematuria Skin: Rash Neurological system: Dizziness which is improved.  Headache has improved.  No other focal symptoms Lower extremity no swelling Multiple other systems have been reviewed and they are negative  As per HPI. Otherwise, a complete review of systems is negatve.  PAST MEDICAL HISTORY: Past Medical History  Diagnosis Date  . Rheumatoid arthritis ([place])   . Hypertension   . Cancer [facility]) 2012    breast   . Breast cancer ([place])   . Cancer of left breast, stage 4 ([place]) 06/25/2014  . Collagen vascular disease ([place])     PAST SURGICAL HISTORY: Past Surgical History  Procedure Laterality Date  . Breast surgery  2010    lumpectomy  . Tubal ligation  1993  . Carpal tunnel release Bilateral   . Portacath placement  01/19/14    FAMILY HISTORY Family History  Problem Relation Age of Onset  . Cancer Father     colon        ADVANCED DIRECTIVES:  patient was advised and discuss her living will.  All available resources were offered   HEALTH MAINTENANCE: Social History  Substance Use Topics  . Smoking status: Never Smoker   . Smokeless tobacco: Never Used  . Alcohol Use: No      Allergies  Allergen Reactions  . Carboplatin Shortness Of Breath    Other reaction(s): Tight chest (finding)    Current Outpatient Prescriptions  Medication Sig Dispense Refill  . dexamethasone (DECADRON) 4 MG tablet  Take 2 tablets (8 mg total) by mouth 2 (two) times daily.    . Ferrous Sulfate (SLOW FE) 142 (45 FE) MG TBCR Take 1 tablet by mouth daily.    . fluticasone (FLONASE) 50 MCG/ACT nasal spray Place 1 spray into both nostrils daily.   0  . folic acid (FOLVITE) 1 MG tablet Take 1 mg by mouth daily.    Marland Kitchen gabapentin (NEURONTIN) 300 MG capsule take 1 capsule by mouth three times a day  0  . hydrochlorothiazide (MICROZIDE) 12.5 MG capsule Take 12.5 mg by mouth daily.    Marland Kitchen HYDROcodone-acetaminophen (NORCO/VICODIN) 5-325 MG tablet Take 1 tablet by mouth every 4 (four) hours as needed for moderate pain. 60 tablet 0  . hydroxychloroquine (PLAQUENIL) 200 MG tablet Take 200 mg by mouth daily.   0  . ibuprofen (ADVIL,MOTRIN) 800 MG tablet Take 800 mg by mouth every 8 (eight) hours as needed.    . lidocaine-prilocaine (EMLA) cream Apply 1 application topically as needed. 30 min prior to accessing port 30 g 3  . loratadine (CLARITIN) 10 MG tablet Take 10 mg by mouth daily.    . magnesium chloride (SLOW-MAG) 64 MG TBEC SR tablet Take 1 tablet (64 mg total) by mouth 2 (two) times daily. 60 tablet 3  . nystatin (MYCOSTATIN) 100000 UNIT/ML suspension take 5 milliliters by mouth four times a day 120 mL 0  . ondansetron (ZOFRAN ODT) 8 MG disintegrating tablet Take 1 tablet (8 mg total) by mouth every 8 (eight) hours as needed for nausea or vomiting. 30 tablet 0  . pantoprazole (PROTONIX) 40 MG tablet Take 40 mg by mouth daily.    . promethazine (PHENERGAN) 25 MG tablet Take 25 mg by mouth every 6 (six) hours as needed for nausea or vomiting.    . ranitidine (ZANTAC) 150 MG capsule Take 1 capsule (150 mg total) by mouth 2 (two) times daily. 28 capsule 0  . sucralfate (CARAFATE) 1 G tablet Take 1 tablet (1 g total) by mouth 4 (four) times daily. 120 tablet 1  . acyclovir (ZOVIRAX) 400 MG tablet take 1 tablet by mouth five times a day for 7 days  0   No current facility-administered medications for this visit.    Facility-Administered Medications Ordered in Other Visits  Medication Dose Route Frequency Provider Last Rate Last Dose  . heparin lock flush 100 unit/mL  500 Units Intravenous Once Forest Gleason, MD      . ondansetron (ZOFRAN) 8 mg, dexamethasone (DECADRON) 10 mg in sodium chloride 0.9 % 50 mL IVPB   Intravenous Once Forest Gleason, MD      . sodium chloride 0.9 % injection 10 mL  10 mL Intracatheter PRN Forest Gleason, MD      . sodium chloride 0.9 % injection 10 mL  10 mL Intravenous PRN Forest Gleason, MD   10 mL at 12/30/14 0903    OBJECTIVE: Filed Vitals:   02/24/15 0931  BP: 154/96  Pulse: 80  Temp: 96.6 F (35.9 C)     Body mass index is 30.18  kg/(m^2).    ECOG FS:1 - Symptomatic but completely ambulatory  Physical Exam8 general status: Performance status is good.  Patient has not lost significant weight HEENT: No evidence of stomatitis. Sclera and conjunctivae :: No jaundice.   pale looking.alopecia. There might be herpetic lesion in the left frontal lbone  Lungs: Air  entry equal on both sides.  No rhonchi.  No rales.  Cardiac: Heart sounds are normal.  No pericardial rub.  No murmur. Lymphatic system: Cervical, axillary, inguinal, lymph nodes not palpable GI: Abdomen is soft.  Palpable liver 3 finger below costal margin.  Tender.  Lower extremity: No edema Neurological system: Higher functions, cranial nerves intact no evidence of peripheral neuropathy. Skin: No rash.  No ecchymosis.. Examination of left breast shows nodular growth.  Right breast free of masses Left breast there is more drainage from the nodular growth.   LAB RESULTS: \  All lab data and emergency room visit has been reviewed independently      Lab Results  Component Value Date   WBC 10.2 02/24/2015   NEUTROABS 6.9* 02/24/2015   HGB 11.2* 02/24/2015   HCT 34.9* 02/24/2015   MCV 81.4 02/24/2015   PLT 282 02/24/2015      Chemistry         Lab Results  Component Value Date   LABCA2 131.9*  02/10/2015      ASSESSMENT and PlAN: Stage IV carcinoma breast Progressing disease by CT scan New onset of brain metastases 2.  All emergency room records have been reviewed.  CT scan of the brain has been reviewed.  Shows multiple metastases. Patient is on 24 mg of Decadron which has been decreased to 16 mg daily Patient will start radiation therapy I will discuss with Dr. Baruch Gouty regarding any need for MRI scan of brain. 3.  Patient was supposed to start occipital chemotherapy because of progressing disease.  However as patient is starting radiation therapy we would hold Doxil chemotherapy Patient was not accompanied with any family.  Patient has a poor grasp of overall disease and prognosis.  I have requested patient to bring family during her next appointment 4.  All other lab data has been reviewed. Anemia is multifactorial

## 2015-03-01 ENCOUNTER — Ambulatory Visit
Admission: RE | Admit: 2015-03-01 | Discharge: 2015-03-01 | Disposition: A | Discharge status: Home / Self Care | Payer: Medicare Other | Source: Ambulatory Visit | Attending: Radiation Oncology | Admitting: Radiation Oncology

## 2015-03-01 DIAGNOSIS — Z510 Encounter for antineoplastic radiation therapy: Secondary | ICD-10-CM | POA: Diagnosis not present

## 2015-03-02 ENCOUNTER — Ambulatory Visit
Admission: RE | Admit: 2015-03-02 | Discharge: 2015-03-02 | Disposition: A | Discharge status: Home / Self Care | Payer: Medicare Other | Source: Ambulatory Visit | Attending: Radiation Oncology | Admitting: Radiation Oncology

## 2015-03-02 DIAGNOSIS — Z510 Encounter for antineoplastic radiation therapy: Secondary | ICD-10-CM | POA: Diagnosis not present

## 2015-03-03 ENCOUNTER — Ambulatory Visit
Admission: RE | Admit: 2015-03-03 | Discharge: 2015-03-03 | Disposition: A | Discharge status: Home / Self Care | Payer: Medicare Other | Source: Ambulatory Visit | Attending: Radiation Oncology | Admitting: Radiation Oncology

## 2015-03-03 DIAGNOSIS — Z510 Encounter for antineoplastic radiation therapy: Secondary | ICD-10-CM | POA: Diagnosis not present

## 2015-03-04 ENCOUNTER — Ambulatory Visit
Admission: RE | Admit: 2015-03-04 | Discharge: 2015-03-04 | Disposition: A | Discharge status: Home / Self Care | Payer: Medicare Other | Source: Ambulatory Visit | Attending: Radiation Oncology | Admitting: Radiation Oncology

## 2015-03-04 DIAGNOSIS — Z510 Encounter for antineoplastic radiation therapy: Secondary | ICD-10-CM | POA: Diagnosis not present

## 2015-03-07 ENCOUNTER — Ambulatory Visit
Admission: RE | Admit: 2015-03-07 | Discharge: 2015-03-07 | Disposition: A | Discharge status: Home / Self Care | Payer: Medicare Other | Source: Ambulatory Visit | Attending: Radiation Oncology | Admitting: Radiation Oncology

## 2015-03-07 DIAGNOSIS — Z510 Encounter for antineoplastic radiation therapy: Secondary | ICD-10-CM | POA: Diagnosis not present

## 2015-03-08 ENCOUNTER — Ambulatory Visit
Admission: RE | Admit: 2015-03-08 | Discharge: 2015-03-08 | Disposition: A | Discharge status: Home / Self Care | Payer: Medicare Other | Source: Ambulatory Visit | Attending: Radiation Oncology | Admitting: Radiation Oncology

## 2015-03-08 DIAGNOSIS — Z510 Encounter for antineoplastic radiation therapy: Secondary | ICD-10-CM | POA: Diagnosis not present

## 2015-03-09 ENCOUNTER — Inpatient Hospital Stay: Payer: Medicare Other

## 2015-03-09 ENCOUNTER — Ambulatory Visit
Admission: RE | Admit: 2015-03-09 | Discharge: 2015-03-09 | Disposition: A | Discharge status: Home / Self Care | Payer: Medicare Other | Source: Ambulatory Visit | Attending: Radiation Oncology | Admitting: Radiation Oncology

## 2015-03-09 DIAGNOSIS — Z510 Encounter for antineoplastic radiation therapy: Secondary | ICD-10-CM | POA: Diagnosis not present

## 2015-03-09 DIAGNOSIS — C50912 Malignant neoplasm of unspecified site of left female breast: Secondary | ICD-10-CM | POA: Diagnosis not present

## 2015-03-09 DIAGNOSIS — C7931 Secondary malignant neoplasm of brain: Secondary | ICD-10-CM

## 2015-03-09 LAB — CBC
HEMATOCRIT: 34.1 % — AB (ref 35.0–47.0)
Hemoglobin: 11.1 g/dL — ABNORMAL LOW (ref 12.0–16.0)
MCH: 26.4 pg (ref 26.0–34.0)
MCHC: 32.6 g/dL (ref 32.0–36.0)
MCV: 81.1 fL (ref 80.0–100.0)
PLATELETS: 186 10*3/uL (ref 150–440)
RBC: 4.21 MIL/uL (ref 3.80–5.20)
RDW: 24.6 % — AB (ref 11.5–14.5)
WBC: 9.8 10*3/uL (ref 3.6–11.0)

## 2015-03-10 ENCOUNTER — Ambulatory Visit
Admission: RE | Admit: 2015-03-10 | Discharge: 2015-03-10 | Disposition: A | Discharge status: Home / Self Care | Payer: Medicare Other | Source: Ambulatory Visit | Attending: Radiation Oncology | Admitting: Radiation Oncology

## 2015-03-10 DIAGNOSIS — Z510 Encounter for antineoplastic radiation therapy: Secondary | ICD-10-CM | POA: Diagnosis not present

## 2015-03-11 ENCOUNTER — Ambulatory Visit
Admission: RE | Admit: 2015-03-11 | Discharge: 2015-03-11 | Disposition: A | Discharge status: Home / Self Care | Payer: Medicare Other | Source: Ambulatory Visit | Attending: Radiation Oncology | Admitting: Radiation Oncology

## 2015-03-11 DIAGNOSIS — Z510 Encounter for antineoplastic radiation therapy: Secondary | ICD-10-CM | POA: Diagnosis not present

## 2015-03-14 ENCOUNTER — Ambulatory Visit
Admission: RE | Admit: 2015-03-14 | Discharge: 2015-03-14 | Disposition: A | Discharge status: Home / Self Care | Payer: Medicare Other | Source: Ambulatory Visit | Attending: Radiation Oncology | Admitting: Radiation Oncology

## 2015-03-14 DIAGNOSIS — Z510 Encounter for antineoplastic radiation therapy: Secondary | ICD-10-CM | POA: Diagnosis not present

## 2015-03-15 ENCOUNTER — Inpatient Hospital Stay (HOSPITAL_BASED_OUTPATIENT_CLINIC_OR_DEPARTMENT_OTHER): Payer: Medicare Other | Admitting: Oncology

## 2015-03-15 ENCOUNTER — Inpatient Hospital Stay: Payer: Medicare Other

## 2015-03-15 ENCOUNTER — Encounter: Payer: Self-pay | Admitting: Oncology

## 2015-03-15 ENCOUNTER — Ambulatory Visit
Admission: RE | Admit: 2015-03-15 | Discharge: 2015-03-15 | Disposition: A | Discharge status: Home / Self Care | Payer: Medicare Other | Source: Ambulatory Visit | Attending: Radiation Oncology | Admitting: Radiation Oncology

## 2015-03-15 VITALS — BP 123/92 | HR 114 | Temp 95.5°F | Wt 148.4 lb

## 2015-03-15 DIAGNOSIS — C787 Secondary malignant neoplasm of liver and intrahepatic bile duct: Secondary | ICD-10-CM

## 2015-03-15 DIAGNOSIS — C7800 Secondary malignant neoplasm of unspecified lung: Secondary | ICD-10-CM | POA: Diagnosis not present

## 2015-03-15 DIAGNOSIS — C50912 Malignant neoplasm of unspecified site of left female breast: Secondary | ICD-10-CM | POA: Diagnosis not present

## 2015-03-15 DIAGNOSIS — Z800 Family history of malignant neoplasm of digestive organs: Secondary | ICD-10-CM

## 2015-03-15 DIAGNOSIS — Z510 Encounter for antineoplastic radiation therapy: Secondary | ICD-10-CM | POA: Diagnosis not present

## 2015-03-15 DIAGNOSIS — Z9221 Personal history of antineoplastic chemotherapy: Secondary | ICD-10-CM

## 2015-03-15 DIAGNOSIS — R451 Restlessness and agitation: Secondary | ICD-10-CM

## 2015-03-15 DIAGNOSIS — F419 Anxiety disorder, unspecified: Secondary | ICD-10-CM

## 2015-03-15 DIAGNOSIS — Z923 Personal history of irradiation: Secondary | ICD-10-CM

## 2015-03-15 DIAGNOSIS — R1013 Epigastric pain: Secondary | ICD-10-CM

## 2015-03-15 DIAGNOSIS — Z79899 Other long term (current) drug therapy: Secondary | ICD-10-CM

## 2015-03-15 DIAGNOSIS — D649 Anemia, unspecified: Secondary | ICD-10-CM

## 2015-03-15 DIAGNOSIS — I10 Essential (primary) hypertension: Secondary | ICD-10-CM

## 2015-03-15 DIAGNOSIS — C7931 Secondary malignant neoplasm of brain: Secondary | ICD-10-CM | POA: Diagnosis not present

## 2015-03-15 DIAGNOSIS — M359 Systemic involvement of connective tissue, unspecified: Secondary | ICD-10-CM

## 2015-03-15 DIAGNOSIS — M069 Rheumatoid arthritis, unspecified: Secondary | ICD-10-CM

## 2015-03-15 LAB — COMPREHENSIVE METABOLIC PANEL
ALBUMIN: 2.7 g/dL — AB (ref 3.5–5.0)
ALK PHOS: 313 U/L — AB (ref 38–126)
ALT: 54 U/L (ref 14–54)
AST: 89 U/L — AB (ref 15–41)
Anion gap: 12 (ref 5–15)
BUN: 15 mg/dL (ref 6–20)
CALCIUM: 8.7 mg/dL — AB (ref 8.9–10.3)
CO2: 26 mmol/L (ref 22–32)
CREATININE: 0.9 mg/dL (ref 0.44–1.00)
Chloride: 92 mmol/L — ABNORMAL LOW (ref 101–111)
GFR calc Af Amer: >60 mL/min (ref >60)
GFR calc non Af Amer: >60 mL/min (ref >60)
GLUCOSE: 88 mg/dL (ref 65–99)
Potassium: 4.4 mmol/L (ref 3.5–5.1)
SODIUM: 130 mmol/L — AB (ref 135–145)
Total Bilirubin: 1.2 mg/dL (ref 0.3–1.2)
Total Protein: 7.4 g/dL (ref 6.5–8.1)

## 2015-03-15 LAB — CBC WITH DIFFERENTIAL/PLATELET
BASOS PCT: 1 %
Basophils Absolute: 0 10*3/uL (ref 0–0.1)
EOS ABS: 0.1 10*3/uL (ref 0–0.7)
Eosinophils Relative: 3 %
HCT: 33.1 % — ABNORMAL LOW (ref 35.0–47.0)
HEMOGLOBIN: 10.6 g/dL — AB (ref 12.0–16.0)
Lymphocytes Relative: 25 %
Lymphs Abs: 1.2 10*3/uL (ref 1.0–3.6)
MCH: 26 pg (ref 26.0–34.0)
MCHC: 31.9 g/dL — ABNORMAL LOW (ref 32.0–36.0)
MCV: 81.4 fL (ref 80.0–100.0)
Monocytes Absolute: 0.6 10*3/uL (ref 0.2–0.9)
Monocytes Relative: 13 %
NEUTROS PCT: 58 %
Neutro Abs: 2.8 10*3/uL (ref 1.4–6.5)
Platelets: 235 10*3/uL (ref 150–440)
RBC: 4.07 MIL/uL (ref 3.80–5.20)
RDW: 23.4 % — ABNORMAL HIGH (ref 11.5–14.5)
WBC: 4.7 10*3/uL (ref 3.6–11.0)

## 2015-03-15 MED ORDER — TRAMADOL HCL 50 MG PO TABS: 50.0000 mg | ORAL_TABLET | Freq: Four times a day (QID) | ORAL | Status: DC | PRN

## 2015-03-15 NOTE — Progress Notes (Signed)
Patient states she has to take Tramadol to take at night to be able to sleep.  States she hurts in her chest when she lies down.

## 2015-03-15 NOTE — Progress Notes (Signed)
[place] @ [facility] Telephone:(336) [phone]  Fax:(336) [phone]     Rabab Currington OB: [birth year]  MR#: [id number]  HEN#:[id number]  Patient Care Team: Jodi Marble, MD as PCP - General (Internal Medicine) Forest Gleason, MD (Unknown Physician Specialty) Christene Lye, MD (General Surgery) Julieanne Manson Leeanne Mannan., MD as Consulting Physician (Rheumatology)  CHIEF COMPLAINT:  Chief Complaint  Patient presents with  . Breast Cancer    Oncology History   1. Carcinoma of breast (left) diagnosis on June 14, by a core needle biopsy and lymph node biopsy. Estrogen receptor negative, progesterone receptor negative, HER-2 receptor negative. 2. Status post lumpectomy (January, 2012), 0.9 cm tumor, one positive lymph node.  Margins may be involved. AJCC Staging: [id number]_N1_M_0 Stage Grouping: status postchemotherapy and radiation treatment. 3.  MyRisk genetic mutation study is negative for any mutation. 4.  Patient also has had due to Rheumatoid arthritis on methotrexate tablet. 5.  PET scan shows progressive disease with multiple liver metastases and lung metastases (November, 18th, 2015)  6.  Patient was started on chemotherapy with carboplatinum /abraxene  day 1 day 8 schedule biopsy from jugular lymph node  was positive for metastatic breast cancer. 7.  Because of poor tolerance to chemotherapy with carboplatinum and Taxol patient was started on ERIBULIN from May of 2016       8.  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  Started radiation therapy in January of 2017 Progressing disease in the liver and lung by CT scan in December of 2016.  eribulin was discontinued 9.  Patient had a brain metastases has finished radiation therapy on January 24 th  2017       INTERVAL HISTORY  [name] with a stage IV carcinoma of breast.  Triple negative disease.  Patient is finishing up radiation therapy to the brain.  Has a pain in the right  epigastric area.  Tramadol is controlling pain.  Patient does not remember whether she is taking any Zantac or any other any H2 blocker therapy. No nausea no vomiting no diarrhea appetite has been stable.   REVIEW OF SYSTEMS:   ROS Gen. status: Patient is somewhat anxious.  Patient is off Decadron at present time HEENT: Soreness in the mouth but no difficulty swallowing no headache patient is off Decadron at present time Epigastric discomfort and pain is improved  Lungs: Dry hacking cough.  Shortness of breath on exertion.  No hemoptysis no chest pain GI: No nausea no vomiting no diarrhea GU: No dysuria or hematuria Skin: Rash Neurological system: Dizziness which is improved.  Headache has improved.  No other focal symptoms Lower extremity no swelling Multiple other systems have been reviewed and they are negative  As per HPI. Otherwise, a complete review of systems is negatve.  PAST MEDICAL HISTORY: Past Medical History  Diagnosis Date  . Rheumatoid arthritis ([place])   . Hypertension   . Cancer [facility]) 2012    breast   . Breast cancer ([place])   . Cancer of left breast, stage 4 ([place]) 06/25/2014  . Collagen vascular disease ([place])     PAST SURGICAL HISTORY: Past Surgical History  Procedure Laterality Date  . Breast surgery  2010    lumpectomy  . Tubal ligation  1993  . Carpal tunnel release Bilateral   . Portacath placement  01/19/14    FAMILY HISTORY Family History  Problem Relation Age of Onset  . Cancer Father     colon  ADVANCED DIRECTIVES:  patient was advised and discuss her living will.  All available resources were offered   HEALTH MAINTENANCE: Social History  Substance Use Topics  . Smoking status: Never Smoker   . Smokeless tobacco: Never Used  . Alcohol Use: No      Allergies  Allergen Reactions  . Carboplatin Shortness Of Breath    Other reaction(s): Tight chest (finding)    Current Outpatient Prescriptions  Medication Sig Dispense Refill   . Ferrous Sulfate (SLOW FE) 142 (45 FE) MG TBCR Take 1 tablet by mouth daily.    . fluticasone (FLONASE) 50 MCG/ACT nasal spray Place 1 spray into both nostrils daily.   0  . folic acid (FOLVITE) 1 MG tablet Take 1 mg by mouth daily.    Marland Kitchen gabapentin (NEURONTIN) 300 MG capsule take 1 capsule by mouth three times a day  0  . hydrochlorothiazide (MICROZIDE) 12.5 MG capsule Take 12.5 mg by mouth daily.    Marland Kitchen HYDROcodone-acetaminophen (NORCO/VICODIN) 5-325 MG tablet Take 1 tablet by mouth every 4 (four) hours as needed for moderate pain. 60 tablet 0  . hydroxychloroquine (PLAQUENIL) 200 MG tablet Take 200 mg by mouth daily.   0  . ibuprofen (ADVIL,MOTRIN) 800 MG tablet Take 800 mg by mouth every 8 (eight) hours as needed.    . lidocaine-prilocaine (EMLA) cream Apply 1 application topically as needed. 30 min prior to accessing port 30 g 3  . loratadine (CLARITIN) 10 MG tablet Take 10 mg by mouth daily.    . magnesium chloride (SLOW-MAG) 64 MG TBEC SR tablet Take 1 tablet (64 mg total) by mouth 2 (two) times daily. 60 tablet 3  . nystatin (MYCOSTATIN) 100000 UNIT/ML suspension take 5 milliliters by mouth four times a day 120 mL 0  . ondansetron (ZOFRAN ODT) 8 MG disintegrating tablet Take 1 tablet (8 mg total) by mouth every 8 (eight) hours as needed for nausea or vomiting. 30 tablet 0  . pantoprazole (PROTONIX) 40 MG tablet Take 40 mg by mouth daily.    . promethazine (PHENERGAN) 25 MG tablet Take 25 mg by mouth every 6 (six) hours as needed for nausea or vomiting.    . sucralfate (CARAFATE) 1 G tablet Take 1 tablet (1 g total) by mouth 4 (four) times daily. 120 tablet 1  . traMADol (ULTRAM) 50 MG tablet Take 1 tablet (50 mg total) by mouth every 6 (six) hours as needed for moderate pain. 30 tablet    No current facility-administered medications for this visit.   Facility-Administered Medications Ordered in Other Visits  Medication Dose Route Frequency Provider Last Rate Last Dose  . heparin lock  flush 100 unit/mL  500 Units Intravenous Once Forest Gleason, MD      . ondansetron (ZOFRAN) 8 mg, dexamethasone (DECADRON) 10 mg in sodium chloride 0.9 % 50 mL IVPB   Intravenous Once Forest Gleason, MD      . sodium chloride 0.9 % injection 10 mL  10 mL Intracatheter PRN Forest Gleason, MD      . sodium chloride 0.9 % injection 10 mL  10 mL Intravenous PRN Forest Gleason, MD   10 mL at 12/30/14 0903    OBJECTIVE: Filed Vitals:   03/15/15 1208  BP: 123/92  Pulse: 114  Temp: 95.5 F (35.3 C)     Body mass index is 28.99 kg/(m^2).    ECOG FS:1 - Symptomatic but completely ambulatory  Physical Exam8 general status: Performance status is good.  Patient has not lost  significant weight HEENT: No evidence of stomatitis. Sclera and conjunctivae :: No jaundice.   pale looking.alopecia. There might be herpetic lesion in the left frontal lbone  Lungs: Air  entry equal on both sides.  No rhonchi.  No rales.  Cardiac: Heart sounds are normal.  No pericardial rub.  No murmur. Lymphatic system: Cervical, axillary, inguinal, lymph nodes not palpable GI: Abdomen is soft.  Palpable liver 3 finger below costal margin.  Tender.  Lower extremity: No edema Neurological system: Higher functions, cranial nerves intact no evidence of peripheral neuropathy. Skin: No rash.  No ecchymosis.. Examination of left breast shows nodular growth.  Right breast free of masses Left breast there is more drainage from the nodular growth.   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  Consider further chemotherapy versus available protocol We will reevaluate patient in 2 weeks and consider her for protocol evaluation. Epigastric discomfort pain is controlled with tramadol patient was advised to bring all the pain medication Discussed situation with the research nurse. Duration of visit is 25 minutes and 50% of time was spent discussing VARIOUS  options or palliatve care with other physicians and research staff regarding available protocol

## 2015-03-16 LAB — CANCER ANTIGEN 27.29: CA 27.29: 337.5 U/mL — AB (ref 0.0–38.6)

## 2015-03-24 ENCOUNTER — Telehealth: Payer: Self-pay | Admitting: *Deleted

## 2015-03-24 DIAGNOSIS — C50912 Malignant neoplasm of unspecified site of left female breast: Secondary | ICD-10-CM

## 2015-03-24 MED ORDER — ONDANSETRON 8 MG PO TBDP: 8.0000 mg | ORAL_TABLET | Freq: Three times a day (TID) | ORAL | Status: DC | PRN

## 2015-03-24 MED ORDER — TRAMADOL HCL 50 MG PO TABS: 50.0000 mg | ORAL_TABLET | Freq: Four times a day (QID) | ORAL | Status: AC | PRN

## 2015-03-24 NOTE — Telephone Encounter (Signed)
Asking for refill on nausea med and Tramadol, last got # 30 Tramadol on 1/24 Asking about her getting and appt for FU and to start new chemo med

## 2015-03-24 NOTE — Telephone Encounter (Signed)
Med refilled per VO Dr Oliva Bustard. Informed pt appt will be set up some time next week

## 2015-03-31 ENCOUNTER — Other Ambulatory Visit: Payer: Self-pay | Admitting: Oncology

## 2015-03-31 ENCOUNTER — Other Ambulatory Visit: Payer: Self-pay | Admitting: *Deleted

## 2015-03-31 DIAGNOSIS — C50912 Malignant neoplasm of unspecified site of left female breast: Secondary | ICD-10-CM

## 2015-04-06 ENCOUNTER — Encounter: Payer: Self-pay | Admitting: Oncology

## 2015-04-06 ENCOUNTER — Inpatient Hospital Stay: Payer: Medicare Other

## 2015-04-06 ENCOUNTER — Inpatient Hospital Stay: Payer: Medicare Other | Attending: Oncology

## 2015-04-06 ENCOUNTER — Inpatient Hospital Stay (HOSPITAL_BASED_OUTPATIENT_CLINIC_OR_DEPARTMENT_OTHER): Payer: Medicare Other | Admitting: Oncology

## 2015-04-06 VITALS — BP 109/86 | HR 123 | Temp 97.3°F | Resp 18 | Wt 150.0 lb

## 2015-04-06 DIAGNOSIS — C50912 Malignant neoplasm of unspecified site of left female breast: Secondary | ICD-10-CM | POA: Diagnosis present

## 2015-04-06 DIAGNOSIS — C7800 Secondary malignant neoplasm of unspecified lung: Secondary | ICD-10-CM

## 2015-04-06 DIAGNOSIS — C787 Secondary malignant neoplasm of liver and intrahepatic bile duct: Secondary | ICD-10-CM | POA: Diagnosis not present

## 2015-04-06 DIAGNOSIS — C7931 Secondary malignant neoplasm of brain: Secondary | ICD-10-CM | POA: Diagnosis not present

## 2015-04-06 DIAGNOSIS — R0602 Shortness of breath: Secondary | ICD-10-CM | POA: Diagnosis not present

## 2015-04-06 DIAGNOSIS — I999 Unspecified disorder of circulatory system: Secondary | ICD-10-CM

## 2015-04-06 DIAGNOSIS — Z79899 Other long term (current) drug therapy: Secondary | ICD-10-CM | POA: Diagnosis not present

## 2015-04-06 DIAGNOSIS — Z800 Family history of malignant neoplasm of digestive organs: Secondary | ICD-10-CM | POA: Insufficient documentation

## 2015-04-06 DIAGNOSIS — R945 Abnormal results of liver function studies: Secondary | ICD-10-CM | POA: Diagnosis not present

## 2015-04-06 DIAGNOSIS — Z923 Personal history of irradiation: Secondary | ICD-10-CM

## 2015-04-06 DIAGNOSIS — M069 Rheumatoid arthritis, unspecified: Secondary | ICD-10-CM | POA: Diagnosis not present

## 2015-04-06 DIAGNOSIS — R17 Unspecified jaundice: Secondary | ICD-10-CM | POA: Insufficient documentation

## 2015-04-06 DIAGNOSIS — F419 Anxiety disorder, unspecified: Secondary | ICD-10-CM

## 2015-04-06 DIAGNOSIS — Z171 Estrogen receptor negative status [ER-]: Secondary | ICD-10-CM | POA: Diagnosis not present

## 2015-04-06 DIAGNOSIS — R05 Cough: Secondary | ICD-10-CM | POA: Diagnosis not present

## 2015-04-06 DIAGNOSIS — R1013 Epigastric pain: Secondary | ICD-10-CM | POA: Diagnosis not present

## 2015-04-06 DIAGNOSIS — R944 Abnormal results of kidney function studies: Secondary | ICD-10-CM | POA: Diagnosis not present

## 2015-04-06 DIAGNOSIS — R630 Anorexia: Secondary | ICD-10-CM | POA: Diagnosis not present

## 2015-04-06 DIAGNOSIS — I10 Essential (primary) hypertension: Secondary | ICD-10-CM | POA: Insufficient documentation

## 2015-04-06 DIAGNOSIS — Z9221 Personal history of antineoplastic chemotherapy: Secondary | ICD-10-CM | POA: Diagnosis not present

## 2015-04-06 LAB — COMPREHENSIVE METABOLIC PANEL
ALK PHOS: 467 U/L — AB (ref 38–126)
ALT: 100 U/L — ABNORMAL HIGH (ref 14–54)
ANION GAP: 9 (ref 5–15)
AST: 270 U/L — ABNORMAL HIGH (ref 15–41)
Albumin: 2.5 g/dL — ABNORMAL LOW (ref 3.5–5.0)
BILIRUBIN TOTAL: 4.3 mg/dL — AB (ref 0.3–1.2)
BUN: 21 mg/dL — ABNORMAL HIGH (ref 6–20)
CALCIUM: 8.7 mg/dL — AB (ref 8.9–10.3)
CHLORIDE: 98 mmol/L — AB (ref 101–111)
CO2: 20 mmol/L — ABNORMAL LOW (ref 22–32)
CREATININE: 0.84 mg/dL (ref 0.44–1.00)
Glucose, Bld: 105 mg/dL — ABNORMAL HIGH (ref 65–99)
Potassium: 4.2 mmol/L (ref 3.5–5.1)
Sodium: 127 mmol/L — ABNORMAL LOW (ref 135–145)
TOTAL PROTEIN: 6.8 g/dL (ref 6.5–8.1)

## 2015-04-06 LAB — CBC WITH DIFFERENTIAL/PLATELET
Basophils Absolute: 0 10*3/uL (ref 0–0.1)
Basophils Relative: 1 %
EOS ABS: 0 10*3/uL (ref 0–0.7)
Eosinophils Relative: 0 %
HEMATOCRIT: 34.7 % — AB (ref 35.0–47.0)
HEMOGLOBIN: 11.5 g/dL — AB (ref 12.0–16.0)
LYMPHS ABS: 1.9 10*3/uL (ref 1.0–3.6)
LYMPHS PCT: 21 %
MCH: 27.8 pg (ref 26.0–34.0)
MCHC: 33 g/dL (ref 32.0–36.0)
MCV: 84.2 fL (ref 80.0–100.0)
MONOS PCT: 11 %
Monocytes Absolute: 1 10*3/uL — ABNORMAL HIGH (ref 0.2–0.9)
NEUTROS ABS: 6 10*3/uL (ref 1.4–6.5)
NEUTROS PCT: 67 %
Platelets: 190 10*3/uL (ref 150–440)
RBC: 4.13 MIL/uL (ref 3.80–5.20)
RDW: 24.1 % — ABNORMAL HIGH (ref 11.5–14.5)
WBC: 8.9 10*3/uL (ref 3.6–11.0)

## 2015-04-06 MED ORDER — HEPARIN SOD (PORK) LOCK FLUSH 100 UNIT/ML IV SOLN
500.0000 [IU] | Freq: Once | INTRAVENOUS | Status: AC | PRN
Start: 2015-04-06 — End: 2015-04-06
  Administered 2015-04-06: 500 [IU]
  Filled 2015-04-06: qty 5

## 2015-04-06 MED ORDER — SODIUM CHLORIDE 0.9 % IV SOLN
Freq: Once | INTRAVENOUS | Status: AC
  Administered 2015-04-06: 15:00:00 via INTRAVENOUS
  Filled 2015-04-06: qty 1000

## 2015-04-06 MED ORDER — SODIUM CHLORIDE 0.9 % IJ SOLN
10.0000 mL | INTRAMUSCULAR | Status: DC | PRN
  Administered 2015-04-06: 10 mL
  Filled 2015-04-06: qty 10

## 2015-04-06 MED ORDER — SODIUM CHLORIDE 0.9 % IV SOLN
Freq: Once | INTRAVENOUS | Status: AC
  Administered 2015-04-06: 15:00:00 via INTRAVENOUS
  Filled 2015-04-06: qty 4

## 2015-04-06 MED ORDER — DEXAMETHASONE 4 MG PO TABS: 4.0000 mg | ORAL_TABLET | Freq: Every day | ORAL | Status: AC

## 2015-04-06 NOTE — Addendum Note (Signed)
Addended by: Telford Nab on: 04/06/2015 03:50 PM   Modules accepted: Orders

## 2015-04-06 NOTE — Progress Notes (Signed)
[place] @ [facility] [phone]  Fax:(336) [phone]     Matylda Fehring OB: 04-18-1960  MR#: [id number]  WEX#:[id number]  Patient Care Team: Jodi Marble, MD as PCP - General (Internal Medicine) Forest Gleason, MD (Unknown Physician Specialty) Christene Lye, MD (General Surgery) Julieanne Manson Leeanne Mannan., MD as Consulting Physician (Rheumatology)  CHIEF COMPLAINT:  Chief Complaint  Patient presents with  . Lung Cancer    Oncology History   1. Carcinoma of breast (left) diagnosis on June 14, by a core needle biopsy and lymph node biopsy. Estrogen receptor negative, progesterone receptor negative, HER-2 receptor negative. 2. Status post lumpectomy (January, 2012), 0.9 cm tumor, one positive lymph node.  Margins may be involved. AJCC Staging: [id number]_0 Stage Grouping: status postchemotherapy and radiation treatment. 3.  MyRisk genetic mutation study is negative for any mutation. 4.  Patient also has had due to Rheumatoid arthritis on methotrexate tablet. 5.  PET scan shows progressive disease with multiple liver metastases and lung metastases (November, 18th, 2015)  6.  Patient was started on chemotherapy with carboplatinum /abraxene  day 1 day 8 schedule biopsy from jugular lymph node  was positive for metastatic breast cancer. 7.  Because of poor tolerance to chemotherapy with carboplatinum and Taxol patient was started on ERIBULIN from May of 2016       8.  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  Started radiation therapy in January of 2017 Progressing disease in the liver and lung by CT scan in December of 2016.  eribulin was discontinued 9.  Patient had a brain metastases has finished radiation therapy on January 24 th  2017 10 patient's condition is declining rapidly has abnormal liver enzymes with bilirubin of 4.3 So Adriamycin was not given as previously planned (April 06, 2015)       INTERVAL HISTORY  55 year old  African-American lady with a stage IV carcinoma of breast.  Triple negative disease.  Patient is finishing up radiation therapy to the brain.  Has a pain in the right epigastric area.  Tramadol is controlling pain.  Patient does not remember whether she is taking any Zantac or any other any H2 blocker therapy. No nausea no vomiting no diarrhea appetite has been stable. Since condition has declined.  Gradually becoming more and more bedridden.  Performance status is declining.  Patient is jaundice.  Poor appetite.     REVIEW OF SYSTEMS:   ROS Gen. status: Patient is somewhat anxious.  Patient is off Decadron at present time HEENT: Soreness in the mouth but no difficulty swallowing no headache patient is off Decadron at present time  jaundice  Epigastric discomfort and pain is improved  Lungs: Dry hacking cough.  Shortness of breath on exertion.  No hemoptysis no chest pain GI: No nausea no vomiting no diarrhea GU: No dysuria or hematuria Skin: Rash Neurological system: Dizziness which is improved.  Headache has improved.  No other focal symptoms Lower extremity no swelling Multiple other systems have been reviewed and they are negative  As per HPI. Otherwise, a complete review of systems is negatve.  PAST MEDICAL HISTORY: Past Medical History  Diagnosis Date  . Rheumatoid arthritis ([place])   . Hypertension   . Cancer [facility]) 2012    breast   . Breast cancer ([place])   . Cancer of left breast, stage 4 ([place]) 06/25/2014  . Collagen vascular disease ([place])     PAST SURGICAL HISTORY: Past Surgical History  Procedure Laterality Date  . Breast  surgery  2010    lumpectomy  . Tubal ligation  1993  . Carpal tunnel release Bilateral   . Portacath placement  01/19/14    FAMILY HISTORY Family History  Problem Relation Age of Onset  . Cancer Father     colon        ADVANCED DIRECTIVES:  patient was advised and discuss her living will.  All available resources were offered   HEALTH  MAINTENANCE: Social History  Substance Use Topics  . Smoking status: Never Smoker   . Smokeless tobacco: Never Used  . Alcohol Use: No      Allergies  Allergen Reactions  . Carboplatin Shortness Of Breath    Other reaction(s): Tight chest (finding)    Current Outpatient Prescriptions  Medication Sig Dispense Refill  . Ferrous Sulfate (SLOW FE) 142 (45 FE) MG TBCR Take 1 tablet by mouth daily.    . fluticasone (FLONASE) 50 MCG/ACT nasal spray Place 1 spray into both nostrils daily.   0  . folic acid (FOLVITE) 1 MG tablet Take 1 mg by mouth daily.    . hydrochlorothiazide (MICROZIDE) 12.5 MG capsule Take 12.5 mg by mouth daily.    Marland Kitchen lidocaine-prilocaine (EMLA) cream Apply 1 application topically as needed. 30 min prior to accessing port 30 g 3  . loratadine (CLARITIN) 10 MG tablet Take 10 mg by mouth daily.    . ondansetron (ZOFRAN ODT) 8 MG disintegrating tablet Take 1 tablet (8 mg total) by mouth every 8 (eight) hours as needed for nausea or vomiting. 30 tablet 0  . ranitidine (ZANTAC) 150 MG capsule Take 150 mg by mouth 2 (two) times daily.  0  . sucralfate (CARAFATE) 1 G tablet Take 1 tablet (1 g total) by mouth 4 (four) times daily. 120 tablet 1  . traMADol (ULTRAM) 50 MG tablet Take 1 tablet (50 mg total) by mouth every 6 (six) hours as needed for moderate pain. 60 tablet 0  . HYDROcodone-acetaminophen (NORCO/VICODIN) 5-325 MG tablet Take 1 tablet by mouth every 4 (four) hours as needed for moderate pain. (Patient not taking: Reported on 04/06/2015) 60 tablet 0   No current facility-administered medications for this visit.   Facility-Administered Medications Ordered in Other Visits  Medication Dose Route Frequency Provider Last Rate Last Dose  . heparin lock flush 100 unit/mL  500 Units Intravenous Once Forest Gleason, MD      . ondansetron (ZOFRAN) 8 mg, dexamethasone (DECADRON) 10 mg in sodium chloride 0.9 % 50 mL IVPB   Intravenous Once Forest Gleason, MD      . sodium chloride  0.9 % injection 10 mL  10 mL Intracatheter PRN Forest Gleason, MD      . sodium chloride 0.9 % injection 10 mL  10 mL Intravenous PRN Forest Gleason, MD   10 mL at 12/30/14 0903    OBJECTIVE: Filed Vitals:   04/06/15 1439  BP: 109/86  Pulse: 123  Temp: 97.3 F (36.3 C)  Resp: 18     Body mass index is 29.3 kg/(m^2).    ECOG FS:1 - Symptomatic but completely ambulatory  Physical Exam8 general status: Performance status is good.  Patient has not lost significant weight HEENT: Jaundice::    pale looking.alopecia. There might be herpetic lesion in the left frontal lbone  Lungs: Air  entry equal on both sides.  No rhonchi.  No rales.  Cardiac: Heart sounds are normal.  No pericardial rub.  No murmur. Lymphatic system: Cervical, axillary, inguinal, lymph nodes not  palpable GI: Abdomen is soft.  Palpable liver 3 finger below costal margin.  Tender.  Lower extremity: No edema Neurological system: Higher functions, cranial nerves intact no evidence of peripheral neuropathy. Skin: No rash.  No ecchymosis.. Examination of left breast shows nodular growth.  Right breast free of masses Left breast there is more drainage from the nodular growth.   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 Patient is progressive deterioration of the liver enzymes with a rising bilirubin  I am not sure whether patient will able to tolerate any further chemotherapy IV fluid IV Decadron and IV Zofran for symptomatic relief of nausea I would like to sit down with family they  are on their way to discuss possibility of hospice and palliative care Total duration of visit was 45 minutes.  50% or more time was spent in counseling patient and family regarding prognosis and options of treatment and available resources

## 2015-04-13 ENCOUNTER — Inpatient Hospital Stay (HOSPITAL_BASED_OUTPATIENT_CLINIC_OR_DEPARTMENT_OTHER): Payer: Medicare Other | Admitting: Oncology

## 2015-04-13 ENCOUNTER — Inpatient Hospital Stay: Payer: Medicare Other

## 2015-04-13 ENCOUNTER — Encounter: Payer: Self-pay | Admitting: Oncology

## 2015-04-13 ENCOUNTER — Other Ambulatory Visit: Payer: Self-pay | Admitting: *Deleted

## 2015-04-13 VITALS — BP 112/78 | HR 118 | Temp 96.1°F | Resp 18 | Wt 150.0 lb

## 2015-04-13 DIAGNOSIS — R630 Anorexia: Secondary | ICD-10-CM

## 2015-04-13 DIAGNOSIS — C787 Secondary malignant neoplasm of liver and intrahepatic bile duct: Secondary | ICD-10-CM

## 2015-04-13 DIAGNOSIS — I999 Unspecified disorder of circulatory system: Secondary | ICD-10-CM

## 2015-04-13 DIAGNOSIS — R1013 Epigastric pain: Secondary | ICD-10-CM

## 2015-04-13 DIAGNOSIS — F419 Anxiety disorder, unspecified: Secondary | ICD-10-CM

## 2015-04-13 DIAGNOSIS — Z923 Personal history of irradiation: Secondary | ICD-10-CM

## 2015-04-13 DIAGNOSIS — Z171 Estrogen receptor negative status [ER-]: Secondary | ICD-10-CM

## 2015-04-13 DIAGNOSIS — R0602 Shortness of breath: Secondary | ICD-10-CM

## 2015-04-13 DIAGNOSIS — C7800 Secondary malignant neoplasm of unspecified lung: Secondary | ICD-10-CM

## 2015-04-13 DIAGNOSIS — C50912 Malignant neoplasm of unspecified site of left female breast: Secondary | ICD-10-CM

## 2015-04-13 DIAGNOSIS — R944 Abnormal results of kidney function studies: Secondary | ICD-10-CM

## 2015-04-13 DIAGNOSIS — M069 Rheumatoid arthritis, unspecified: Secondary | ICD-10-CM

## 2015-04-13 DIAGNOSIS — Z800 Family history of malignant neoplasm of digestive organs: Secondary | ICD-10-CM

## 2015-04-13 DIAGNOSIS — R17 Unspecified jaundice: Secondary | ICD-10-CM

## 2015-04-13 DIAGNOSIS — C801 Malignant (primary) neoplasm, unspecified: Secondary | ICD-10-CM

## 2015-04-13 DIAGNOSIS — Z79899 Other long term (current) drug therapy: Secondary | ICD-10-CM

## 2015-04-13 DIAGNOSIS — I10 Essential (primary) hypertension: Secondary | ICD-10-CM

## 2015-04-13 DIAGNOSIS — C7931 Secondary malignant neoplasm of brain: Secondary | ICD-10-CM

## 2015-04-13 DIAGNOSIS — Z9221 Personal history of antineoplastic chemotherapy: Secondary | ICD-10-CM

## 2015-04-13 DIAGNOSIS — R05 Cough: Secondary | ICD-10-CM

## 2015-04-13 LAB — COMPREHENSIVE METABOLIC PANEL
ALT: 123 U/L — AB (ref 14–54)
AST: 253 U/L — AB (ref 15–41)
Albumin: 2.4 g/dL — ABNORMAL LOW (ref 3.5–5.0)
Alkaline Phosphatase: 657 U/L — ABNORMAL HIGH (ref 38–126)
Anion gap: 9 (ref 5–15)
BILIRUBIN TOTAL: 8.2 mg/dL — AB (ref 0.3–1.2)
BUN: 22 mg/dL — AB (ref 6–20)
CALCIUM: 8.8 mg/dL — AB (ref 8.9–10.3)
CHLORIDE: 97 mmol/L — AB (ref 101–111)
CO2: 20 mmol/L — ABNORMAL LOW (ref 22–32)
CREATININE: 0.65 mg/dL (ref 0.44–1.00)
Glucose, Bld: 89 mg/dL (ref 65–99)
Potassium: 4.1 mmol/L (ref 3.5–5.1)
Sodium: 126 mmol/L — ABNORMAL LOW (ref 135–145)
TOTAL PROTEIN: 6.5 g/dL (ref 6.5–8.1)

## 2015-04-13 LAB — CBC WITH DIFFERENTIAL/PLATELET
Basophils Absolute: 0.1 10*3/uL (ref 0–0.1)
Basophils Relative: 1 %
EOS PCT: 0 %
Eosinophils Absolute: 0 10*3/uL (ref 0–0.7)
HCT: 34.1 % — ABNORMAL LOW (ref 35.0–47.0)
HEMOGLOBIN: 11.3 g/dL — AB (ref 12.0–16.0)
LYMPHS ABS: 1.7 10*3/uL (ref 1.0–3.6)
LYMPHS PCT: 15 %
MCH: 28.7 pg (ref 26.0–34.0)
MCHC: 33.1 g/dL (ref 32.0–36.0)
MCV: 86.8 fL (ref 80.0–100.0)
MONOS PCT: 7 %
Monocytes Absolute: 0.8 10*3/uL (ref 0.2–0.9)
Neutro Abs: 8.9 10*3/uL — ABNORMAL HIGH (ref 1.4–6.5)
Neutrophils Relative %: 77 %
PLATELETS: 171 10*3/uL (ref 150–440)
RBC: 3.93 MIL/uL (ref 3.80–5.20)
RDW: 23.2 % — ABNORMAL HIGH (ref 11.5–14.5)
WBC: 11.5 10*3/uL — AB (ref 3.6–11.0)

## 2015-04-13 MED ORDER — HEPARIN SOD (PORK) LOCK FLUSH 100 UNIT/ML IV SOLN
500.0000 [IU] | Freq: Once | INTRAVENOUS | Status: AC
  Administered 2015-04-13: 500 [IU] via INTRAVENOUS

## 2015-04-13 MED ORDER — HEPARIN SOD (PORK) LOCK FLUSH 100 UNIT/ML IV SOLN
INTRAVENOUS | Status: AC
  Filled 2015-04-13: qty 5

## 2015-04-13 MED ORDER — SODIUM CHLORIDE 0.9% FLUSH
10.0000 mL | INTRAVENOUS | Status: DC | PRN
  Administered 2015-04-13: 10 mL via INTRAVENOUS
  Filled 2015-04-13: qty 10

## 2015-04-13 NOTE — Progress Notes (Signed)
Patient c/o decreased appetite.  States when she eats or drinks she hurts in her right rib cage.  States she "has no wind".

## 2015-04-13 NOTE — Progress Notes (Signed)
[place] @ [facility] Telephone:(336) [phone]  Fax:(336) [phone]     Beth Flores OB: [birth year]  MR#: [id number]  XLK#:[id number]  Patient Care Team: Jodi Marble, MD as PCP - General (Internal Medicine) Forest Gleason, MD (Unknown Physician Specialty) Christene Lye, MD (General Surgery) Julieanne Manson Leeanne Mannan., MD as Consulting Physician (Rheumatology)  CHIEF COMPLAINT:  Chief Complaint  Patient presents with  . Breast Cancer    Oncology History   1. Carcinoma of breast (left) diagnosis on June 14, by a core needle biopsy and lymph node biopsy. Estrogen receptor negative, progesterone receptor negative, HER-2 receptor negative. 2. Status post lumpectomy (January, 2012), 0.9 cm tumor, one positive lymph node.  Margins may be involved. AJCC Staging: pT1b_N1_M_0 Stage Grouping: status postchemotherapy and radiation treatment. 3.  MyRisk genetic mutation study is negative for any mutation. 4.  Patient also has had due to Rheumatoid arthritis on methotrexate tablet. 5.  PET scan shows progressive disease with multiple liver metastases and lung metastases (November, 18th, 2015)  6.  Patient was started on chemotherapy with carboplatinum /abraxene  day 1 day 8 schedule biopsy from jugular lymph node  was positive for metastatic breast cancer. 7.  Because of poor tolerance to chemotherapy with carboplatinum and Taxol patient was started on ERIBULIN from May of 2016       8.  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  Started radiation therapy in January of 2017 Progressing disease in the liver and lung by CT scan in December of 2016.  eribulin was discontinued 9.  Patient had a brain metastases has finished radiation therapy on January 24 th  2017 10 patient's condition is declining rapidly has abnormal liver enzymes with bilirubin of 4.3 So Adriamycin was not given as previously planned (April 06, 2015)       INTERVAL HISTORY  55 year old  African-American lady with a stage IV carcinoma of breast.  Triple negative disease.  Patient is finishing up radiation therapy to the brain.  Has a pain in the right epigastric area.  Tramadol is controlling pain.  Patient does not remember whether she is taking any Zantac or any other any H2 blocker therapy. No nausea no vomiting no diarrhea appetite has been stable. Since condition has declined.  Gradually becoming more and more bedridden.  Performance status is declining.  Patient is jaundice.  Poor appetite.   Patient is here again with her daughter today.  According to her appetite is improved Continues to right upper quadrant pain Family again had number of questions   REVIEW OF SYSTEMS:   ROS Gen. status: Patient is somewhat anxious.  Patient is off Decadron at present time HEENT: Soreness in the mouth but no difficulty swallowing no headache patient is off Decadron at present time  jaundice  Epigastric discomfort and pain is improved  Lungs: Dry hacking cough.  Shortness of breath on exertion.  No hemoptysis no chest pain GI: No nausea no vomiting no diarrhea GU: No dysuria or hematuria Skin: Rash Neurological system: Dizziness which is improved.  Headache has improved.  No other focal symptoms Lower extremity no swelling Multiple other systems have been reviewed and they are negative  As per HPI. Otherwise, a complete review of systems is negatve.  PAST MEDICAL HISTORY: Past Medical History  Diagnosis Date  . Rheumatoid arthritis ([place])   . Hypertension   . Cancer [facility]) 2012    breast   . Breast cancer ([place])   . Cancer of left breast,  stage 4 ([place]) 06/25/2014  . Collagen vascular disease ([place])     PAST SURGICAL HISTORY: Past Surgical History  Procedure Laterality Date  . Breast surgery  2010    lumpectomy  . Tubal ligation  1993  . Carpal tunnel release Bilateral   . Portacath placement  01/19/14    FAMILY HISTORY Family History  Problem Relation Age of Onset    . Cancer Father     colon        ADVANCED DIRECTIVES:  patient was advised and discuss her living will.  All available resources were offered   HEALTH MAINTENANCE: Social History  Substance Use Topics  . Smoking status: Never Smoker   . Smokeless tobacco: Never Used  . Alcohol Use: No      Allergies  Allergen Reactions  . Carboplatin Shortness Of Breath    Other reaction(s): Tight chest (finding)    Current Outpatient Prescriptions  Medication Sig Dispense Refill  . dexamethasone (DECADRON) 4 MG tablet Take 1 tablet (4 mg total) by mouth daily. 30 tablet 0  . Ferrous Sulfate (SLOW FE) 142 (45 FE) MG TBCR Take 1 tablet by mouth daily.    . fluticasone (FLONASE) 50 MCG/ACT nasal spray Place 1 spray into both nostrils daily.   0  . folic acid (FOLVITE) 1 MG tablet Take 1 mg by mouth daily.    . hydrochlorothiazide (MICROZIDE) 12.5 MG capsule Take 12.5 mg by mouth daily.    Marland Kitchen HYDROcodone-acetaminophen (NORCO/VICODIN) 5-325 MG tablet Take 1 tablet by mouth every 4 (four) hours as needed for moderate pain. 60 tablet 0  . lidocaine-prilocaine (EMLA) cream Apply 1 application topically as needed. 30 min prior to accessing port 30 g 3  . loratadine (CLARITIN) 10 MG tablet Take 10 mg by mouth daily.    . ondansetron (ZOFRAN ODT) 8 MG disintegrating tablet Take 1 tablet (8 mg total) by mouth every 8 (eight) hours as needed for nausea or vomiting. 30 tablet 0  . ranitidine (ZANTAC) 150 MG capsule Take 150 mg by mouth 2 (two) times daily.  0  . sucralfate (CARAFATE) 1 G tablet Take 1 tablet (1 g total) by mouth 4 (four) times daily. 120 tablet 1  . traMADol (ULTRAM) 50 MG tablet Take 1 tablet (50 mg total) by mouth every 6 (six) hours as needed for moderate pain. 60 tablet 0   No current facility-administered medications for this visit.   Facility-Administered Medications Ordered in Other Visits  Medication Dose Route Frequency Provider Last Rate Last Dose  . heparin lock flush 100  unit/mL  500 Units Intravenous Once Forest Gleason, MD      . ondansetron (ZOFRAN) 8 mg, dexamethasone (DECADRON) 10 mg in sodium chloride 0.9 % 50 mL IVPB   Intravenous Once Forest Gleason, MD      . sodium chloride 0.9 % injection 10 mL  10 mL Intracatheter PRN Forest Gleason, MD      . sodium chloride 0.9 % injection 10 mL  10 mL Intravenous PRN Forest Gleason, MD   10 mL at 12/30/14 0903    OBJECTIVE: Filed Vitals:   04/13/15 0921  BP: 112/78  Pulse: 118  Temp: 96.1 F (35.6 C)  Resp: 18     Body mass index is 29.3 kg/(m^2).    ECOG FS:1 - Symptomatic but completely ambulatory  Physical Exam8 general status: Performance status is good.  Patient has not lost significant weight HEENT: Jaundice::    pale looking.alopecia. There might be herpetic lesion  in the left frontal lbone  Lungs: Air  entry equal on both sides.  No rhonchi.  No rales.  Cardiac: Heart sounds are normal.  No pericardial rub.  No murmur. Lymphatic system: Cervical, axillary, inguinal, lymph nodes not palpable GI: Abdomen is soft.  Palpable liver 3 finger below costal margin.  Tender.  Lower extremity: No edema Neurological system: Higher functions, cranial nerves intact no evidence of peripheral neuropathy. Skin: No rash.  No ecchymosis.. Examination of left breast shows nodular growth.  Right breast free of masses Left breast there is more drainage from the nodular growth.   LAB RESULTS: \ All lab data has been reviewed      Lab Results  Component Value Date   WBC 11.5* 04/13/2015   NEUTROABS 8.9* 04/13/2015   HGB 11.3* 04/13/2015   HCT 34.1* 04/13/2015   MCV 86.8 04/13/2015   PLT 171 04/13/2015      Chemistry         Lab Results  Component Value Date   LABCA2 337.5* 03/15/2015      ASSESSMENT and PlAN: Stage IV carcinoma breast Progressing disease by CT scan Metastases to the brain finished her radiation therapy patient is off steroid. Patient is progressive deterioration of the liver  enzymes with a rising bilirubin  Patient's bilirubin has gone up to 8.2 I would like to do ultrasound of abdomen to decide whether there is any extrahepatic or extra biliary blockage causing obstructive jaundice  I again discussed possibility of continuing chemotherapy benefits versus side effects and quality of life Family is still discussing that issue.  They are discussing issues regarding hospice and palliative care Evaluate patient after information is available from ultrasound    Duration of visit is 25 minutes and 50% of time was spent discussing VARIOUS  options or aT liative h other physicians social worker

## 2015-04-14 ENCOUNTER — Ambulatory Visit
Admission: RE | Admit: 2015-04-14 | Discharge: 2015-04-14 | Disposition: A | Discharge status: Home / Self Care | Payer: Medicare Other | Source: Ambulatory Visit | Attending: Oncology | Admitting: Oncology

## 2015-04-14 DIAGNOSIS — R935 Abnormal findings on diagnostic imaging of other abdominal regions, including retroperitoneum: Secondary | ICD-10-CM | POA: Diagnosis not present

## 2015-04-14 DIAGNOSIS — R188 Other ascites: Secondary | ICD-10-CM | POA: Insufficient documentation

## 2015-04-14 DIAGNOSIS — R17 Unspecified jaundice: Secondary | ICD-10-CM

## 2015-04-14 DIAGNOSIS — J90 Pleural effusion, not elsewhere classified: Secondary | ICD-10-CM | POA: Diagnosis not present

## 2015-04-14 DIAGNOSIS — C50912 Malignant neoplasm of unspecified site of left female breast: Secondary | ICD-10-CM

## 2015-04-18 ENCOUNTER — Inpatient Hospital Stay: Payer: Medicare Other | Admitting: Oncology

## 2015-04-18 ENCOUNTER — Inpatient Hospital Stay: Payer: Medicare Other

## 2015-04-18 ENCOUNTER — Ambulatory Visit: Payer: Medicare Other | Admitting: Radiation Oncology

## 2015-04-19 ENCOUNTER — Encounter: Payer: Self-pay | Admitting: Radiation Oncology

## 2015-04-19 ENCOUNTER — Inpatient Hospital Stay (HOSPITAL_BASED_OUTPATIENT_CLINIC_OR_DEPARTMENT_OTHER): Payer: Medicare Other | Admitting: Oncology

## 2015-04-19 ENCOUNTER — Inpatient Hospital Stay: Payer: Medicare Other

## 2015-04-19 ENCOUNTER — Ambulatory Visit
Admission: RE | Admit: 2015-04-19 | Discharge: 2015-04-19 | Disposition: A | Discharge status: Home / Self Care | Payer: Medicare Other | Source: Ambulatory Visit | Attending: Radiation Oncology | Admitting: Radiation Oncology

## 2015-04-19 ENCOUNTER — Encounter: Payer: Self-pay | Admitting: Oncology

## 2015-04-19 VITALS — BP 104/72 | HR 122 | Temp 98.0°F | Wt 150.1 lb

## 2015-04-19 DIAGNOSIS — I999 Unspecified disorder of circulatory system: Secondary | ICD-10-CM

## 2015-04-19 DIAGNOSIS — Z79899 Other long term (current) drug therapy: Secondary | ICD-10-CM

## 2015-04-19 DIAGNOSIS — R944 Abnormal results of kidney function studies: Secondary | ICD-10-CM

## 2015-04-19 DIAGNOSIS — C7800 Secondary malignant neoplasm of unspecified lung: Secondary | ICD-10-CM | POA: Diagnosis not present

## 2015-04-19 DIAGNOSIS — Z923 Personal history of irradiation: Secondary | ICD-10-CM

## 2015-04-19 DIAGNOSIS — R0602 Shortness of breath: Secondary | ICD-10-CM

## 2015-04-19 DIAGNOSIS — Z800 Family history of malignant neoplasm of digestive organs: Secondary | ICD-10-CM

## 2015-04-19 DIAGNOSIS — C7931 Secondary malignant neoplasm of brain: Secondary | ICD-10-CM

## 2015-04-19 DIAGNOSIS — F419 Anxiety disorder, unspecified: Secondary | ICD-10-CM

## 2015-04-19 DIAGNOSIS — C50912 Malignant neoplasm of unspecified site of left female breast: Secondary | ICD-10-CM

## 2015-04-19 DIAGNOSIS — R17 Unspecified jaundice: Secondary | ICD-10-CM

## 2015-04-19 DIAGNOSIS — Z171 Estrogen receptor negative status [ER-]: Secondary | ICD-10-CM | POA: Diagnosis not present

## 2015-04-19 DIAGNOSIS — R05 Cough: Secondary | ICD-10-CM

## 2015-04-19 DIAGNOSIS — R945 Abnormal results of liver function studies: Secondary | ICD-10-CM

## 2015-04-19 DIAGNOSIS — Z9221 Personal history of antineoplastic chemotherapy: Secondary | ICD-10-CM

## 2015-04-19 DIAGNOSIS — C787 Secondary malignant neoplasm of liver and intrahepatic bile duct: Secondary | ICD-10-CM

## 2015-04-19 DIAGNOSIS — R1013 Epigastric pain: Secondary | ICD-10-CM

## 2015-04-19 DIAGNOSIS — M069 Rheumatoid arthritis, unspecified: Secondary | ICD-10-CM

## 2015-04-19 DIAGNOSIS — I10 Essential (primary) hypertension: Secondary | ICD-10-CM

## 2015-04-19 DIAGNOSIS — R630 Anorexia: Secondary | ICD-10-CM

## 2015-04-19 MED ORDER — HYDROCODONE-ACETAMINOPHEN 5-325 MG PO TABS: 1.0000 | ORAL_TABLET | ORAL | Status: AC | PRN

## 2015-04-19 MED ORDER — ONDANSETRON 8 MG PO TBDP: 8.0000 mg | ORAL_TABLET | Freq: Three times a day (TID) | ORAL | Status: AC | PRN

## 2015-04-19 NOTE — Progress Notes (Signed)
Radiation Oncology Follow up Note : In our call our Name: Cyndy Acquisto   Date:   04/19/2015 MRN:  [id number] DOB: 04-21-1960    This 55 y.o. female presents to the clinic today for I follow-up for stage IV breast cancer with brain metastasis.  REFERRING PROVIDER: Jodi Marble, MD  HPI: Patient is a 55 year old female now out 1 month having completed whole brain radiation therapy secondary to brain metastasis for stage IV breast cancer with liver and lung involvement. She is seen today one month out is declining. She has obvious scleral icterus and is known to have a rapidly rising liver enzymes with a bilirubin of 4.3.. Her tumor is triple negative. She's having some slight headaches no other change in mental status or neurologic status at this time. She's been seen by medical oncology and hospice care has been offered.  COMPLICATIONS OF TREATMENT: none  FOLLOW UP COMPLIANCE: keeps appointments   PHYSICAL EXAM:  BP 104/72 mmHg  Pulse 122  Temp(Src) 98 F (36.7 C)  Wt 150 lb 2.1 oz (68.1 kg) Well-developed female wheelchair-bound with obvious scleral icterus. She does have oral candidiasis. Cranial nerves II through XII are grossly intact crude visual fields are within normal range she does have some slight decreased strength in her lower extremities not certain about etiology. Well-developed well-nourished patient in NAD. HEENT reveals PERLA, EOMI, discs not visualized.  Oral cavity is clear. No oral mucosal lesions are identified. Neck is clear without evidence of cervical or supraclavicular adenopathy. Lungs are clear to A&P. Cardiac examination is essentially unremarkable with regular rate and rhythm without murmur rub or thrill. Abdomen is benign with no organomegaly or masses noted. Motor sensory and DTR levels are equal and symmetric in the upper and lower extremities. Cranial nerves II through XII are grossly intact. Proprioception is intact. No peripheral adenopathy or edema is  identified. No motor or sensory levels are noted. Crude visual fields are within normal range.  RADIOLOGY RESULTS: No current films for review  PLAN: At this time patient is rapidly declining with significant metastasis to brain and liver and lung. She is seen medical oncology who is recommending hospice care. I'm turning follow-up care over to medical oncology. Would be happy to reevaluate patient at any time should further palliative treatment be indicated. I would like to take this opportunity for allowing me to participate in the care of your patient.Armstead Peaks., MD

## 2015-04-19 NOTE — Progress Notes (Signed)
[place] @ [facility] [phone]  [phone]     Beth Flores OB: Nov 25, 1960  MR#: [id number]  FBP#:[id number]  Patient Care Team: Jodi Marble, MD as PCP - General (Internal Medicine) Forest Gleason, MD (Unknown Physician Specialty) Christene Lye, MD (General Surgery) Julieanne Manson Leeanne Mannan., MD as Consulting Physician (Rheumatology)  CHIEF COMPLAINT:  No chief complaint on file.   Oncology History   1. Carcinoma of breast (left) diagnosis on June 14, by a core needle biopsy and lymph node biopsy. Estrogen receptor negative, progesterone receptor negative, HER-2 receptor negative. 2. Status post lumpectomy (January, 2012), 0.9 cm tumor, one positive lymph node.  Margins may be involved. AJCC Staging: [id number] Stage Grouping: status postchemotherapy and radiation treatment. 3.  MyRisk genetic mutation study is negative for any mutation. 4.  Patient also has had due to Rheumatoid arthritis on methotrexate tablet. 5.  PET scan shows progressive disease with multiple liver metastases and lung metastases (November, 18th, 2015)  6.  Patient was started on chemotherapy with carboplatinum /abraxene  day 1 day 8 schedule biopsy from jugular lymph node  was positive for metastatic breast cancer. 7.  Because of poor tolerance to chemotherapy with carboplatinum and Taxol patient was started on ERIBULIN from May of 2016       8.  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  Started radiation therapy in January of 2017 Progressing disease in the liver and lung by CT scan in December of 2016.  eribulin was discontinued 9.  Patient had a brain metastases has finished radiation therapy on January 24 th  2017 10 patient's condition is declining rapidly has abnormal liver enzymes with bilirubin of 4.3 So Adriamycin was not given as previously planned (April 06, 2015)       INTERVAL HISTORY  [name] with a stage IV  carcinoma of breast.  Triple negative disease.  Patient is finishing up radiation therapy to the brain.  Has a pain in the right epigastric area.  Tramadol is controlling pain.  Patient does not remember whether she is taking any Zantac or any other any H2 blocker therapy. Patient's condition has been declining rapidly.  Getting more and more jaundiced poor performance status Discussing regarding palliative and hospice care patient's family was somewhat angry and depressed.  Daughter wanted another opinion.  Would not disclose the place where she is going for opinion.  Here for further follow-up and treatment consideration   REVIEW OF SYSTEMS:   ROS Gen. status: Patient is somewhat anxious.  Patient is off Decadron at present time HEENT: Soreness in the mouth but no difficulty swallowing no headache patient is off Decadron at present time  jaundice  Epigastric discomfort and pain is improved  Lungs: Dry hacking cough.  Shortness of breath on exertion.  No hemoptysis no chest pain GI: No nausea no vomiting no diarrhea GU: No dysuria or hematuria Skin: Rash Neurological system: Dizziness which is improved.  Headache has improved.  No other focal symptoms Lower extremity no swelling Multiple other systems have been reviewed and they are negative  As per HPI. Otherwise, a complete review of systems is negatve.  PAST MEDICAL HISTORY: Past Medical History  Diagnosis Date  . Rheumatoid arthritis ([place])   . Hypertension   . Cancer [facility]) 2012    breast   . Breast cancer ([place])   . Cancer of left breast, stage 4 ([place]) 06/25/2014  . Collagen vascular disease ([place])     PAST  SURGICAL HISTORY: Past Surgical History  Procedure Laterality Date  . Breast surgery  2010    lumpectomy  . Tubal ligation  1993  . Carpal tunnel release Bilateral   . Portacath placement  01/19/14    FAMILY HISTORY Family History  Problem Relation Age of Onset  . Cancer Father     colon        ADVANCED  DIRECTIVES:  patient was advised and discuss her living will.  All available resources were offered   HEALTH MAINTENANCE: Social History  Substance Use Topics  . Smoking status: Never Smoker   . Smokeless tobacco: Never Used  . Alcohol Use: No      Allergies  Allergen Reactions  . Carboplatin Shortness Of Breath    Other reaction(s): Tight chest (finding)    Current Outpatient Prescriptions  Medication Sig Dispense Refill  . dexamethasone (DECADRON) 4 MG tablet Take 1 tablet (4 mg total) by mouth daily. 30 tablet 0  . Ferrous Sulfate (SLOW FE) 142 (45 FE) MG TBCR Take 1 tablet by mouth daily.    . fluticasone (FLONASE) 50 MCG/ACT nasal spray Place 1 spray into both nostrils daily.   0  . folic acid (FOLVITE) 1 MG tablet Take 1 mg by mouth daily.    . hydrochlorothiazide (MICROZIDE) 12.5 MG capsule Take 12.5 mg by mouth daily.    Marland Kitchen HYDROcodone-acetaminophen (NORCO/VICODIN) 5-325 MG tablet Take 1 tablet by mouth every 4 (four) hours as needed for moderate pain. 60 tablet 0  . lidocaine-prilocaine (EMLA) cream Apply 1 application topically as needed. 30 min prior to accessing port 30 g 3  . loratadine (CLARITIN) 10 MG tablet Take 10 mg by mouth daily.    . ondansetron (ZOFRAN ODT) 8 MG disintegrating tablet Take 1 tablet (8 mg total) by mouth every 8 (eight) hours as needed for nausea or vomiting. 30 tablet 0  . ranitidine (ZANTAC) 150 MG capsule Take 150 mg by mouth 2 (two) times daily.  0  . sucralfate (CARAFATE) 1 G tablet Take 1 tablet (1 g total) by mouth 4 (four) times daily. 120 tablet 1  . traMADol (ULTRAM) 50 MG tablet Take 1 tablet (50 mg total) by mouth every 6 (six) hours as needed for moderate pain. 60 tablet 0   No current facility-administered medications for this visit.   Facility-Administered Medications Ordered in Other Visits  Medication Dose Route Frequency Provider Last Rate Last Dose  . heparin lock flush 100 unit/mL  500 Units Intravenous Once Forest Gleason,  MD      . ondansetron (ZOFRAN) 8 mg, dexamethasone (DECADRON) 10 mg in sodium chloride 0.9 % 50 mL IVPB   Intravenous Once Forest Gleason, MD      . sodium chloride 0.9 % injection 10 mL  10 mL Intracatheter PRN Forest Gleason, MD      . sodium chloride 0.9 % injection 10 mL  10 mL Intravenous PRN Forest Gleason, MD   10 mL at 12/30/14 0903    OBJECTIVE: There were no vitals filed for this visit.   There is no weight on file to calculate BMI.    ECOG FS:1 - Symptomatic but completely ambulatory  Physical Exam8 general status: Performance status is good.  Patient has not lost significant weight HEENT: Jaundice::    pale looking.alopecia. There might be herpetic lesion in the left frontal lbone  Lungs: Air  entry equal on both sides.  No rhonchi.  No rales.  Cardiac: Heart sounds are normal.  No  pericardial rub.  No murmur. Lymphatic system: Cervical, axillary, inguinal, lymph nodes not palpable GI: Abdomen is soft.  Palpable liver 3 finger below costal margin.  Tender.  Lower extremity: No edema Neurological system: Higher functions, cranial nerves intact no evidence of peripheral neuropathy. Skin: No rash.  No ecchymosis.. Examination of left breast shows nodular growth.  Right breast free of masses Left breast there is more drainage from the nodular growth.   LAB RESULTS: \ All lab data has been reviewed      Lab Results  Component Value Date   WBC 11.5* 04/13/2015   NEUTROABS 8.9* 04/13/2015   HGB 11.3* 04/13/2015   HCT 34.1* 04/13/2015   MCV 86.8 04/13/2015   PLT 171 04/13/2015      Chemistry         Lab Results  Component Value Date   LABCA2 337.5* 03/15/2015      ASSESSMENT and PlAN: Stage IV carcinoma breast Progressing disease by CT scan Metastases to the brain finished her radiation therapy patient is off steroid. Patient is progressive deterioration of the liver enzymes with a rising bilirubin  Patient's bilirubin has gone up to 8.2 I would like to do  ultrasound of abdomen to decide whether there is any extrahepatic or extra biliary blockage causing obstructive jaundice  Ultrasound has been reviewed there is possibility of partial thrombosis of portal vein.  This is not a candidate for any anticoagulation therapy.  Getting more and more jaundice.  Multiple liver metastases.  Overall performance status is poor and I doubt any further chemotherapy can be done. Patient and family desires another opinion they do not want any hospice or palliative care at present time daughter is somewhat  Depressed and upset and would not disclose the place that she wants to go We offered to get medical record transfer to their place of choice.  We will be available to help patient has needed basis.    Duration of visit is 25 minutes and 50% of time was spent discussing VARIOUS  options or aT liative h other physicians social worker

## 2015-04-19 NOTE — Progress Notes (Signed)
Patient needs refills for pain and nausea medications.

## 2015-04-22 ENCOUNTER — Emergency Department
Admission: EM | Admit: 2015-04-22 | Discharge: 2015-04-22 | Disposition: A | Discharge status: Home / Self Care | Payer: Medicare Other | Attending: Emergency Medicine | Admitting: Emergency Medicine

## 2015-04-22 DIAGNOSIS — Z79899 Other long term (current) drug therapy: Secondary | ICD-10-CM | POA: Insufficient documentation

## 2015-04-22 DIAGNOSIS — C50912 Malignant neoplasm of unspecified site of left female breast: Secondary | ICD-10-CM | POA: Insufficient documentation

## 2015-04-22 DIAGNOSIS — R197 Diarrhea, unspecified: Secondary | ICD-10-CM | POA: Diagnosis present

## 2015-04-22 DIAGNOSIS — R7401 Elevation of levels of liver transaminase levels: Secondary | ICD-10-CM

## 2015-04-22 DIAGNOSIS — I10 Essential (primary) hypertension: Secondary | ICD-10-CM | POA: Insufficient documentation

## 2015-04-22 DIAGNOSIS — R000 Tachycardia, unspecified: Secondary | ICD-10-CM | POA: Insufficient documentation

## 2015-04-22 DIAGNOSIS — E860 Dehydration: Secondary | ICD-10-CM | POA: Diagnosis not present

## 2015-04-22 DIAGNOSIS — R112 Nausea with vomiting, unspecified: Secondary | ICD-10-CM | POA: Diagnosis not present

## 2015-04-22 DIAGNOSIS — R740 Nonspecific elevation of levels of transaminase and lactic acid dehydrogenase [LDH]: Secondary | ICD-10-CM | POA: Insufficient documentation

## 2015-04-22 DIAGNOSIS — R109 Unspecified abdominal pain: Secondary | ICD-10-CM | POA: Insufficient documentation

## 2015-04-22 LAB — CBC WITH DIFFERENTIAL/PLATELET
BASOS PCT: 0 %
Basophils Absolute: 0 10*3/uL (ref 0–0.1)
Eosinophils Absolute: 0 10*3/uL (ref 0–0.7)
Eosinophils Relative: 0 %
HEMATOCRIT: 30.9 % — AB (ref 35.0–47.0)
HEMOGLOBIN: 10.1 g/dL — AB (ref 12.0–16.0)
LYMPHS ABS: 1.3 10*3/uL (ref 1.0–3.6)
LYMPHS PCT: 22 %
MCH: 29.8 pg (ref 26.0–34.0)
MCHC: 32.8 g/dL (ref 32.0–36.0)
MCV: 90.8 fL (ref 80.0–100.0)
MONOS PCT: 11 %
Monocytes Absolute: 0.6 10*3/uL (ref 0.2–0.9)
NEUTROS ABS: 4 10*3/uL (ref 1.4–6.5)
NEUTROS PCT: 67 %
Platelets: 116 10*3/uL — ABNORMAL LOW (ref 150–440)
RBC: 3.4 MIL/uL — ABNORMAL LOW (ref 3.80–5.20)
RDW: 22.8 % — ABNORMAL HIGH (ref 11.5–14.5)
WBC: 6 10*3/uL (ref 3.6–11.0)

## 2015-04-22 LAB — COMPREHENSIVE METABOLIC PANEL
ALBUMIN: 2 g/dL — AB (ref 3.5–5.0)
ALK PHOS: 692 U/L — AB (ref 38–126)
ALT: 149 U/L — ABNORMAL HIGH (ref 14–54)
ANION GAP: 9 (ref 5–15)
AST: 468 U/L — ABNORMAL HIGH (ref 15–41)
BILIRUBIN TOTAL: 11.9 mg/dL — AB (ref 0.3–1.2)
BUN: 16 mg/dL (ref 6–20)
CALCIUM: 8.8 mg/dL — AB (ref 8.9–10.3)
CO2: 22 mmol/L (ref 22–32)
Chloride: 97 mmol/L — ABNORMAL LOW (ref 101–111)
Creatinine, Ser: 0.41 mg/dL — ABNORMAL LOW (ref 0.44–1.00)
GFR calc Af Amer: >60 mL/min (ref >60)
GFR calc non Af Amer: >60 mL/min (ref >60)
GLUCOSE: 129 mg/dL — AB (ref 65–99)
Potassium: 4.1 mmol/L (ref 3.5–5.1)
Sodium: 128 mmol/L — ABNORMAL LOW (ref 135–145)
TOTAL PROTEIN: 6.1 g/dL — AB (ref 6.5–8.1)

## 2015-04-22 MED ORDER — HEPARIN SOD (PORK) LOCK FLUSH 100 UNIT/ML IV SOLN
500.0000 [IU] | Freq: Once | INTRAVENOUS | Status: AC
  Administered 2015-04-22: 500 [IU] via INTRAVENOUS
  Filled 2015-04-22: qty 5

## 2015-04-22 MED ORDER — SODIUM CHLORIDE 0.9 % IV BOLUS (SEPSIS): 1000.0000 mL | Freq: Once | INTRAVENOUS | Status: DC

## 2015-04-22 MED ORDER — SODIUM CHLORIDE 0.9 % IV BOLUS (SEPSIS)
1000.0000 mL | Freq: Once | INTRAVENOUS | Status: AC
  Administered 2015-04-22: 1000 mL via INTRAVENOUS

## 2015-04-22 NOTE — ED Provider Notes (Signed)
[facility] Emergency Department Provider Note    ____________________________________________  Time seen: ~1745  I have reviewed the triage vital signs and the nursing notes.   HISTORY  Chief Complaint Emesis and Diarrhea   History limited by: Not Limited   HPI Beth Flores is a 55 y.o. female with stage IV breast cancer who presents to the emergency department today because of the family's concern for dehydration. The family states that the patient has not been doing well for a long time. She has had a progressive decline. Per oncology notes the patient has not been offered any further chemotherapy. The family states that they're waiting for a second opinion at [facility] oncology prior to deciding goals of care. He does unfortunately sound like the patient has a terminal illness with the cancer per oncology notes. Family is concerned that the patient is dehydrated since she has not been having adequate by mouth intake and has had some vomiting and diarrhea. Family has not appreciated any fevers. Has not appreciated any shortness of breath or cough.     Past Medical History  Diagnosis Date  . Rheumatoid arthritis ([place])   . Hypertension   . Cancer [facility]) 2012    breast   . Breast cancer ([place])   . Cancer of left breast, stage 4 ([place]) 06/25/2014  . Collagen vascular disease [facility])     Patient Active Problem List   Diagnosis Date Noted  . Cancer of left breast, stage 4 ([place]) 06/25/2014  . 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 Arthritis, degenerative 08/03/2013  . 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 Breast cancer ([place]) 08/03/2013  . Malignant neoplasm of breast ([place]) 08/03/2013  . 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 Breast surgery  2010    lumpectomy  . Tubal ligation  1993  . Carpal tunnel release Bilateral   . Portacath placement  01/19/14    Current Outpatient  Rx  Name  Route  Sig  Dispense  Refill  . dexamethasone (DECADRON) 4 MG tablet   Oral   Take 1 tablet (4 mg total) by mouth daily.   30 tablet   0   . Ferrous Sulfate (SLOW FE) 142 (45 FE) MG TBCR   Oral   Take 1 tablet by mouth daily.         . fluticasone (FLONASE) 50 MCG/ACT nasal spray   Each Nare   Place 1 spray into both nostrils daily.       0   . folic acid (FOLVITE) 1 MG tablet   Oral   Take 1 mg by mouth daily.         . hydrochlorothiazide (MICROZIDE) 12.5 MG capsule   Oral   Take 12.5 mg by mouth daily.         Marland Kitchen HYDROcodone-acetaminophen (NORCO/VICODIN) 5-325 MG tablet   Oral   Take 1 tablet by mouth every 4 (four) hours as needed for moderate pain.   60 tablet   0   . lidocaine-prilocaine (EMLA) cream   Topical   Apply 1 application topically as needed. 30 min prior to accessing port   30 g   3   . loratadine (CLARITIN) 10 MG tablet   Oral   Take 10 mg by mouth daily.         . ondansetron (ZOFRAN ODT) 8 MG disintegrating tablet   Oral   Take 1 tablet (8 mg total)  by mouth every 8 (eight) hours as needed for nausea or vomiting.   30 tablet   0   . ranitidine (ZANTAC) 150 MG capsule   Oral   Take 150 mg by mouth 2 (two) times daily.      0   . sucralfate (CARAFATE) 1 G tablet   Oral   Take 1 tablet (1 g total) by mouth 4 (four) times daily.   120 tablet   1   . traMADol (ULTRAM) 50 MG tablet   Oral   Take 1 tablet (50 mg total) by mouth every 6 (six) hours as needed for moderate pain.   60 tablet   0     Allergies Carboplatin  Family History  Problem Relation Age of Onset  . Cancer Father     colon    Social History Social History  Substance Use Topics  . Smoking status: Never Smoker   . Smokeless tobacco: Never Used  . Alcohol Use: No    Review of Systems  Constitutional: Negative for fever. Cardiovascular: Negative for chest pain. Respiratory: Negative for shortness of breath. Gastrointestinal: Positive  for right sided abdominal pain, nausea vomiting diarrhea Neurological: Negative for headaches, focal weakness or numbness.  10-point ROS otherwise negative.  ____________________________________________   PHYSICAL EXAM:  VITAL SIGNS: ED Triage Vitals  Enc Vitals Group     BP 04/22/15 1718 97/73 mmHg     Pulse Rate 04/22/15 1719 135     Resp 04/22/15 1718 16     Temp 04/22/15 1718 98.1 F (36.7 C)     Temp Source 04/22/15 1718 Oral     SpO2 04/22/15 1718 96 %     Weight 04/22/15 1718 148 lb (67.132 kg)     Height 04/22/15 1718 5' (1.524 m)   Constitutional: Alert and oriented. Well appearing and in no distress. Eyes: Conjunctivae are normal. PERRL. Normal extraocular movements. ENT   Head: Normocephalic and atraumatic.   Nose: No congestion/rhinnorhea.   Mouth/Throat: Mucous membranes are moist.   Neck: No stridor. Hematological/Lymphatic/Immunilogical: No cervical lymphadenopathy. Cardiovascular: Tachycardic, regular rhythm.  No murmurs, rubs, or gallops. Respiratory: Normal respiratory effort without tachypnea nor retractions. Breath sounds are clear and equal bilaterally. No wheezes/rales/rhonchi. Gastrointestinal: Soft and minimally tender in the right side of the abdomen Genitourinary: Deferred Musculoskeletal: Normal range of motion in all extremities. No joint effusions.  No lower extremity tenderness nor edema. Neurologic:  Normal speech and language. No gross focal neurologic deficits are appreciated.  Skin:  Skin is warm, dry and intact. No rash noted. Psychiatric: Mood and affect are normal. Speech and behavior are normal. Patient exhibits appropriate insight and judgment.  ____________________________________________    LABS (pertinent positives/negatives)  Labs Reviewed  CBC WITH DIFFERENTIAL/PLATELET - Abnormal; Notable for the following:    RBC 3.40 (*)    Hemoglobin 10.1 (*)    HCT 30.9 (*)    RDW 22.8 (*)    Platelets 116 (*)    All  other components within normal limits  COMPREHENSIVE METABOLIC PANEL - Abnormal; Notable for the following:    Sodium 128 (*)    Chloride 97 (*)    Glucose, Bld 129 (*)    Creatinine, Ser 0.41 (*)    Calcium 8.8 (*)    Total Protein 6.1 (*)    Albumin 2.0 (*)    AST 468 (*)    ALT 149 (*)    Alkaline Phosphatase 692 (*)    Total Bilirubin 11.9 (*)  All other components within normal limits     ____________________________________________   EKG  I, Nance Pear, attending physician, personally viewed and interpreted this EKG  EKG Time: 1729 Rate: 127 Rhythm: sinus tachycardia Axis: normal Intervals: qtc 428 QRS: narrow ST changes: no st elevation Impression: abnormal ekg ____________________________________________    RADIOLOGY  None   ____________________________________________   PROCEDURES  Procedure(s) performed: None  Critical Care performed: No  ____________________________________________   INITIAL IMPRESSION / ASSESSMENT AND PLAN / ED COURSE  Pertinent labs & imaging results that were available during my care of the patient were reviewed by me and considered in my medical decision making (see chart for details).  Patient was brought in by family today because of concerns for dehydration. Patient has stage IV breast cancer with metastatic disease. Patient was given 1 fluid bolus and did appear to improve. The family and patient chose to leave prior to second fluid bolus. They did not want any further workup in terms of the continued elevation of the liver enzymes. I did discuss this with family and patient. They state they're waiting to see [facility] oncology next week. They want to speak to [facility] oncology before any further plan of care is decided upon.  ____________________________________________   FINAL CLINICAL IMPRESSION(S) / ED DIAGNOSES  Final diagnoses:  Dehydration  Transaminitis  Cancer of left breast, stage 4 ([place])     Nance Pear, MD 04/22/15 2320

## 2015-04-22 NOTE — Discharge Instructions (Signed)
Please seek medical attention for any high fevers, chest pain, shortness of breath, change in behavior, persistent vomiting, bloody stool or any other new or concerning symptoms. ° ° °Dehydration, Adult °Dehydration means your body does not have as much fluid or water as it needs. It happens when you take in less fluid than you lose. Your kidneys, brain, and heart will not work properly without the right amount of fluids.  °Dehydration can range from mild to severe. It should be treated right away to help prevent it from becoming severe. °HOME CARE °· Drink enough fluid to keep your pee (urine) clear or pale yellow. °· Drink water or fluid slowly by taking small sips. You can also try sucking on ice cubes. °· Have food or drinks that contain electrolytes. Examples include bananas and sports drinks. °· Take over-the-counter and prescription medicines only as told by your doctor. °· Prepare oral rehydration solution (ORS) according to the instructions that came with it. Take sips of ORS every 5 minutes until your pee returns to normal. °· If you are throwing up (vomiting) or have watery poop (diarrhea), keep trying to drink water, ORS, or both. °· If you have watery poop, avoid: °¨ Drinks with caffeine. °¨ Fruit juice. °¨ Milk. °¨ Carbonated soft drinks. °· Do not take salt tablets. This can lead to having too much sodium in your body (hypernatremia). °GET HELP IF: °· You cannot eat or drink without throwing up. °· You have had mild watery poop for longer than 24 hours. °· You have a fever. °GET HELP RIGHT AWAY IF:  °· You have very strong thirst. °· You have very bad watery poop. °· You have not peed in 6-8 hours, or you have peed only a small amount of very dark pee. °· You have shriveled skin. °· You are dizzy, confused, or both. °  °This information is not intended to replace advice given to you by your health care provider. Make sure you discuss any questions you have with your health care provider. °  °Document  Released: 12/02/2008 Document Revised: 10/27/2014 Document Reviewed: 06/23/2014 °Elsevier Interactive Patient Education ©2016 [employer] ° °

## 2015-04-22 NOTE — ED Notes (Addendum)
Pt here to get IV fluids; daughter reports pt has had N/V/D x2 weeks; pt is cancer patient, unknown last chemo treatment- Pt thinks it was in January.

## 2015-04-25 ENCOUNTER — Telehealth: Payer: Self-pay | Admitting: *Deleted

## 2015-04-25 NOTE — Telephone Encounter (Signed)
Called patient to check on her to see if she is feeling better.  Patient went to the ED on Friday to get IVF's.  Patient states she got hydration over the weekend and is feeling better.  Patient grateful for call to check on her.

## 2015-05-02 ENCOUNTER — Ambulatory Visit: Payer: Medicare Other

## 2015-05-02 ENCOUNTER — Ambulatory Visit: Payer: Medicare Other | Admitting: Oncology

## 2015-05-04 ENCOUNTER — Telehealth: Payer: Self-pay | Admitting: *Deleted

## 2015-05-04 NOTE — Telephone Encounter (Signed)
States was told she would be referred to hospice, but has not been contacted by anyone regarding opening her to services

## 2015-05-04 NOTE — Telephone Encounter (Signed)
Referral faxed to hospice of [place].

## 2015-05-05 ENCOUNTER — Telehealth: Payer: Self-pay | Admitting: *Deleted

## 2015-05-05 MED ORDER — LORAZEPAM 0.5 MG PO TABS: 0.5000 mg | ORAL_TABLET | Freq: Three times a day (TID) | ORAL | Status: AC

## 2015-05-05 MED ORDER — MORPHINE SULFATE (CONCENTRATE) 20 MG/ML PO SOLN: ORAL | Status: AC

## 2015-05-05 NOTE — Telephone Encounter (Signed)
rx faxed

## 2015-05-05 NOTE — Telephone Encounter (Signed)
Hospice called to request rx for Roxanol and Ativan, pt looks to be actively dying and is a full code. Her O2 sat is 69 %. She keeps rolling her head from side to side. Family wants to keep her at home though the son is recovering from back surgery and the sister works [employer]. The son did contact the family who wants to think about her code status. Verbal order given for home O2

## 2015-05-21 DEATH — deceased

## 2016-04-08 IMAGING — CT CT ANGIO CHEST
2 of 6 series · 18 of 46 positions shown · IV contrast (APPLIED)
Comparison: CT chest of 06/22/2014

CLINICAL DATA: Chest pain, shortness of breath, history of breast
carcinoma, currently on chemotherapy

EXAM:
CT ANGIOGRAPHY CHEST WITH CONTRAST
TECHNIQUE: Multidetector CT imaging of the chest was performed using the
standard protocol during bolus administration of intravenous
contrast. Multiplanar CT image reconstructions and MIPs were
obtained to evaluate the vascular anatomy.
CONTRAST:  75mL OMNIPAQUE IOHEXOL 350 MG/ML SOLN

[Series 5: pe thins 1.5 · axial · 0.68mm/px · z∈[-461,-221]mm · 15 of 224 slices shown]
[im 12/224  lung]
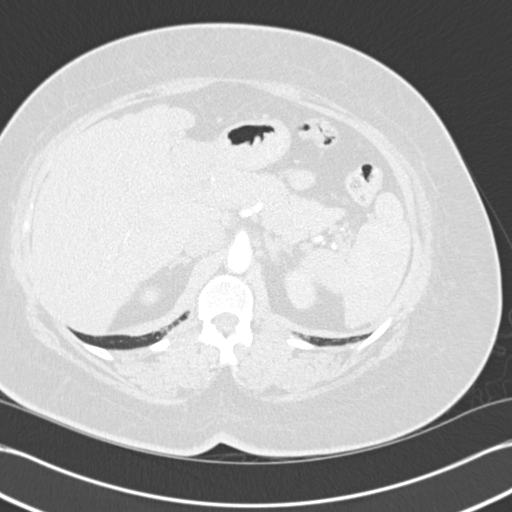
[im 24/224  soft-tissue]
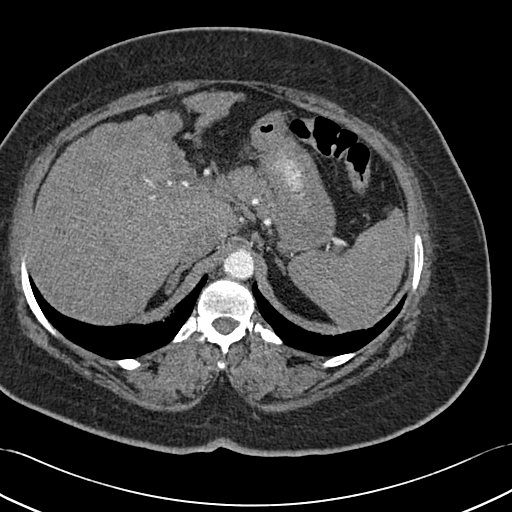
[im 47/224  lung]
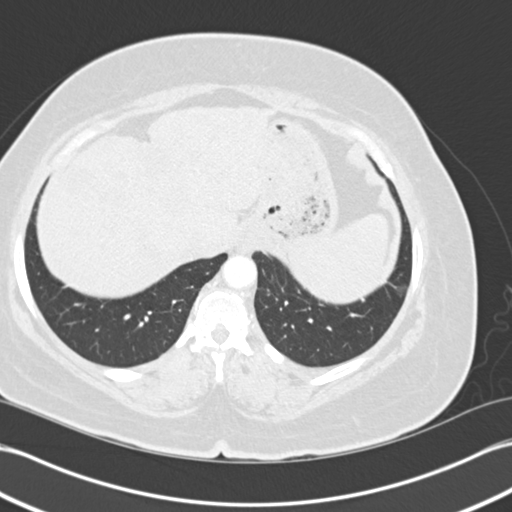
[im 59/224  soft-tissue]
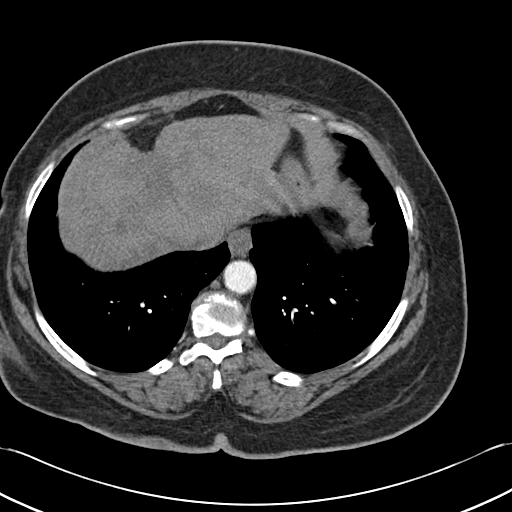
[im 71/224  lung]
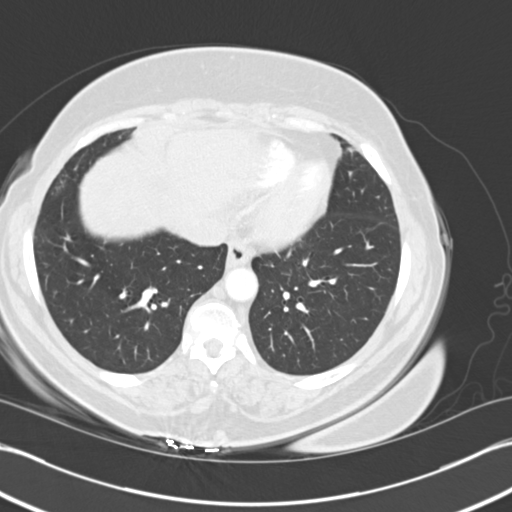
[im 83/224  soft-tissue]
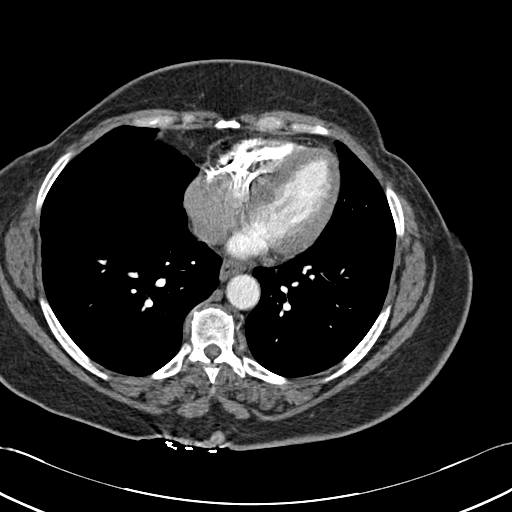
[im 94/224  lung]
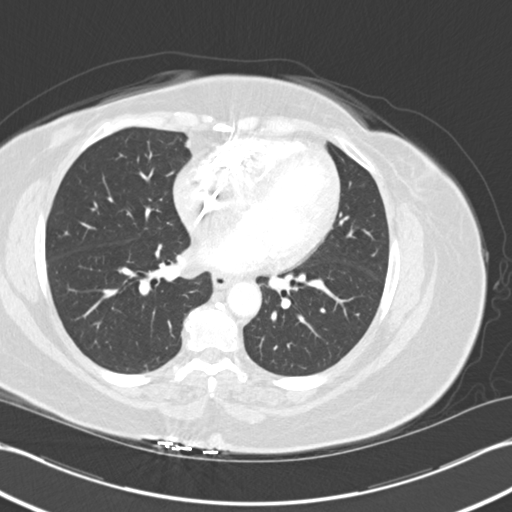
[im 118/224  soft-tissue]
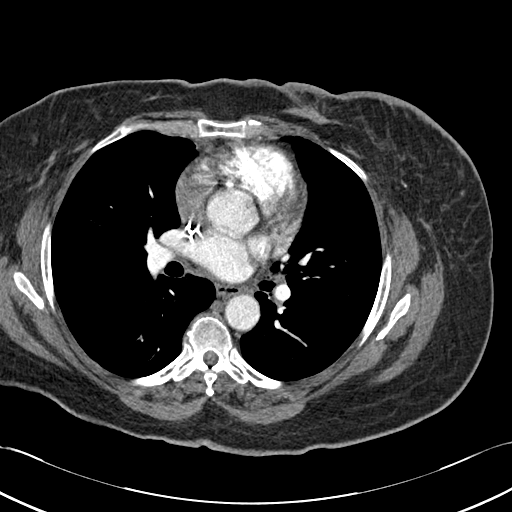
[im 130/224  lung]
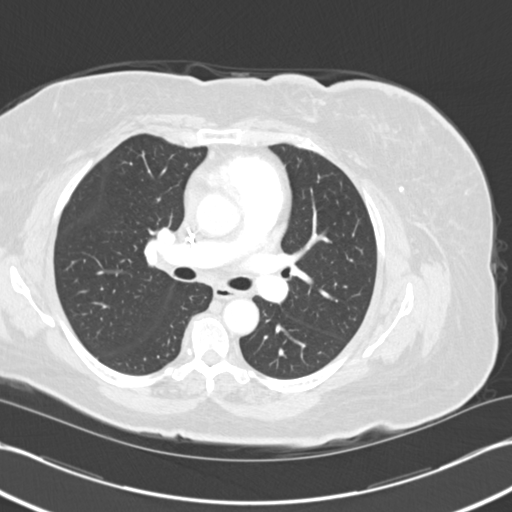
[im 141/224  soft-tissue]
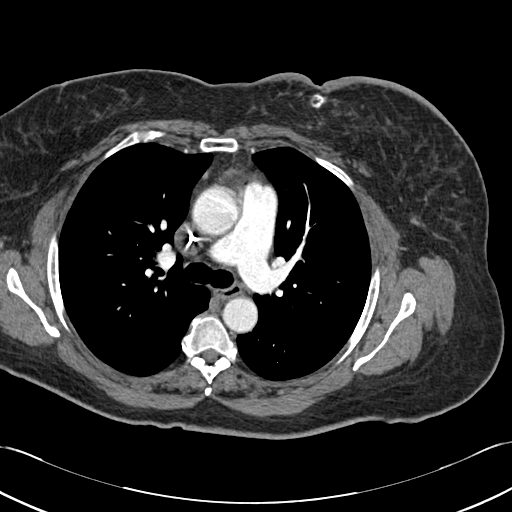
[im 153/224  lung]
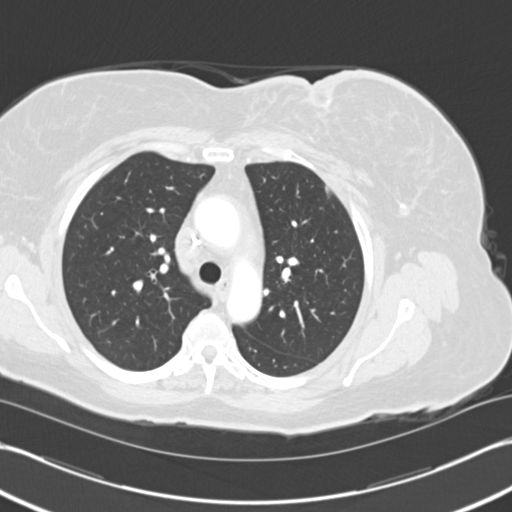
[im 165/224  soft-tissue]
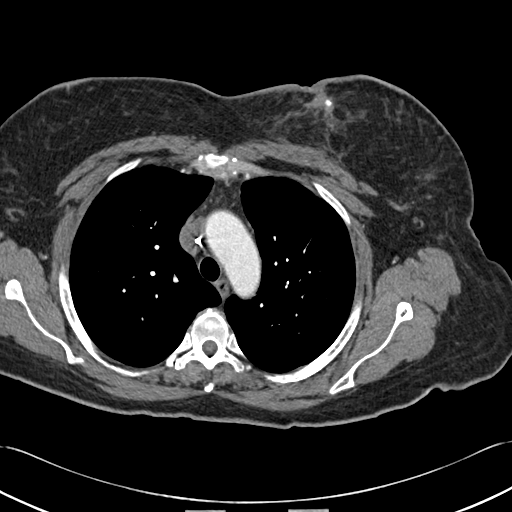
[im 188/224  lung]
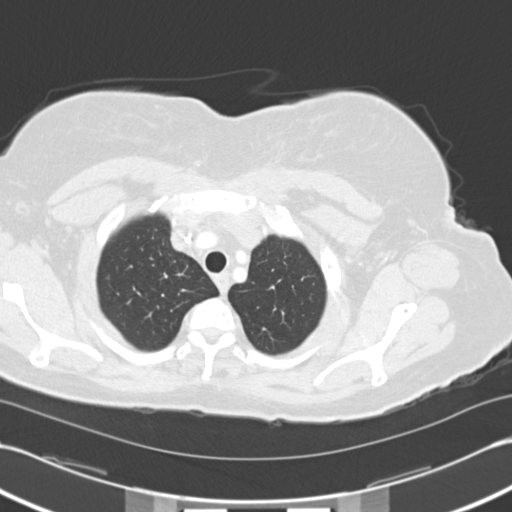
[im 200/224  soft-tissue]
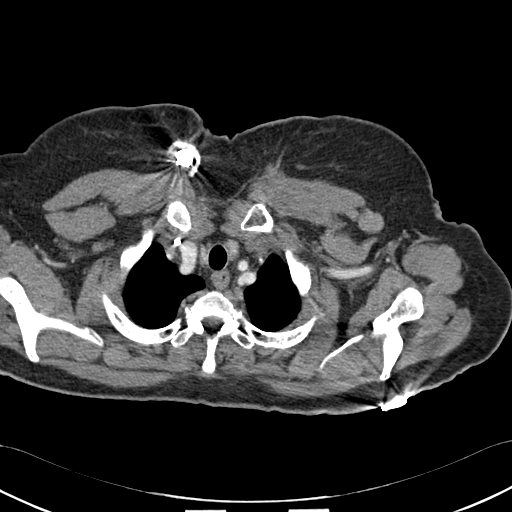
[im 212/224  lung]
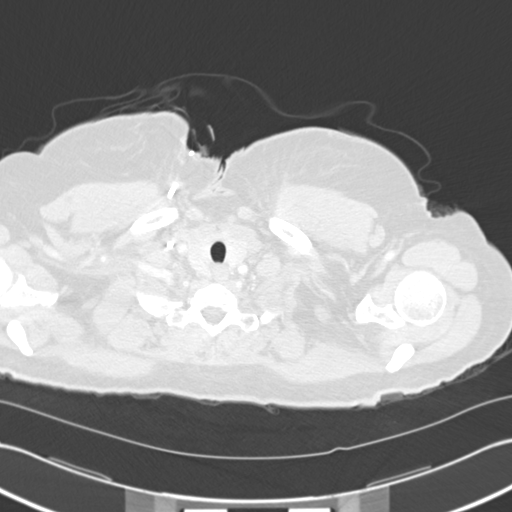

[Series 7: cor mpr 2.0 · coronal · 0.68mm/px · 3 of 115 slices shown]
[im 29/115  soft-tissue]
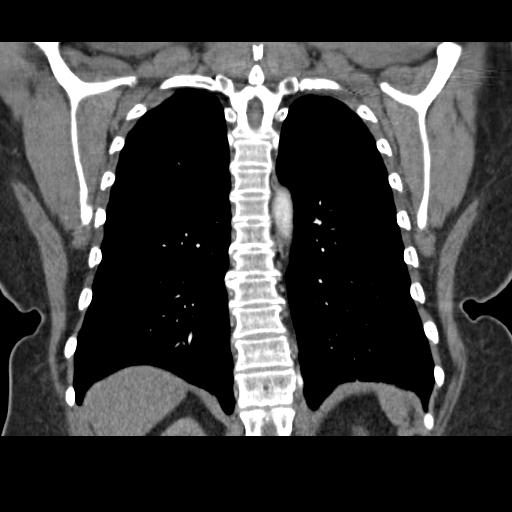
[im 58/115  soft-tissue]
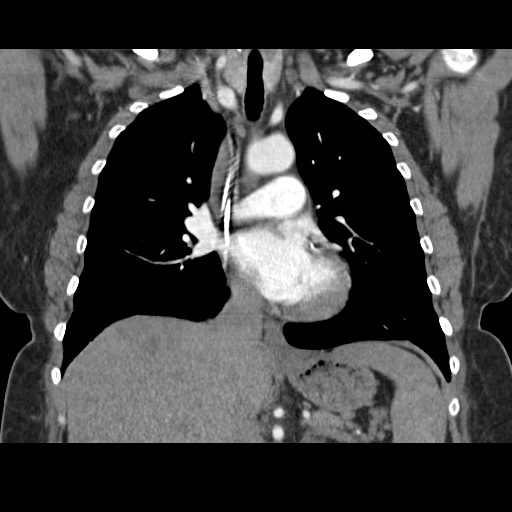
[im 86/115  soft-tissue]
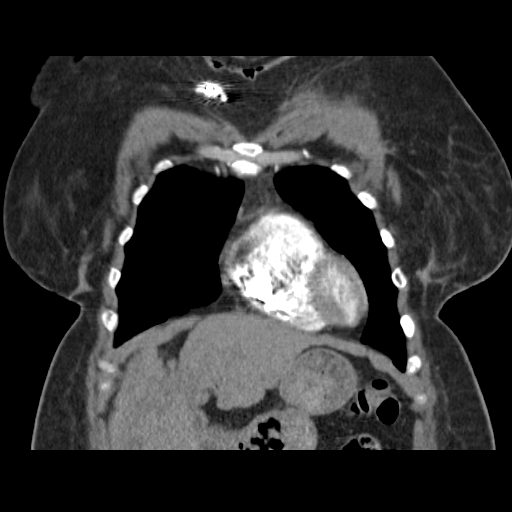

[18 of 46 positions shown; findings below may reference images not displayed]

FINDINGS: The pulmonary arteries opacify well and there is no evidence of
acute pulmonary embolism. The thoracic aorta also is opacify with no
acute abnormality noted. No definite mediastinal or hilar adenopathy
is seen. Central venous line tip extends into the right ventricle.
The thyroid gland is unremarkable.

On lung window images, no new or enlarging lung nodule is seen. The
3 mm nodule noted within the left upper lobe previous the now as
seen on image 31 and is stable in size. A 4 mm nodule is noted in
the superior segment of the left lower lobe, previously measuring 5
mm. No additional lung nodule is seen. No parenchymal infiltrate is
noted. There is no evidence of pleural effusion. The central airway
is patent.

Postoperative changes the medial left breast are noted with left
breast skin thickening again noted. It is difficult to assess
interval change in the metastatic involvement of the liver, but
there may have been some progression of the metastatic involvement
of the liver. Also, contours particularly the left over very nodular
and changes of cirrhosis are consideration. The thoracic vertebrae
are in normal alignment with no bony abnormality evident.

Review of the MIP images confirms the above findings.
IMPRESSION: 1. No evidence of acute pulmonary embolism.
2. Stable to diminished size of the single small lung nodules noted
in the left upper lobe and left lower lobe on the prior CT. No new
or enlarging nodule is seen.
3. Suspicion of progression of metastatic involving the liver
although this is difficult to assess on this unenhanced study.
4. Nodular contours particularly of the left lobe of liver may
indicate cirrhosis. Correlate clinically.

## 2023-11-26 ENCOUNTER — Telehealth: Payer: Self-pay

## 2023-11-26 NOTE — Telephone Encounter (Signed)
 Opened in error
